# Patient Record
Sex: Female | Born: 1985
Health system: Southern US, Community
[De-identification: ages and names within clinical notes are randomized; demographics above are authoritative.]

## PROBLEM LIST (undated history)

## (undated) DIAGNOSIS — R51 Headache: Secondary | ICD-10-CM

## (undated) DIAGNOSIS — Z8619 Personal history of other infectious and parasitic diseases: Secondary | ICD-10-CM

## (undated) DIAGNOSIS — B009 Herpesviral infection, unspecified: Secondary | ICD-10-CM

## (undated) DIAGNOSIS — F1910 Other psychoactive substance abuse, uncomplicated: Secondary | ICD-10-CM

## (undated) DIAGNOSIS — F419 Anxiety disorder, unspecified: Secondary | ICD-10-CM

## (undated) DIAGNOSIS — R609 Edema, unspecified: Secondary | ICD-10-CM

## (undated) DIAGNOSIS — R519 Headache, unspecified: Secondary | ICD-10-CM

## (undated) HISTORY — DX: Herpesviral infection, unspecified: B00.9

## (undated) HISTORY — DX: Anxiety disorder, unspecified: F41.9

## (undated) HISTORY — DX: Other psychoactive substance abuse, uncomplicated: F19.10

## (undated) HISTORY — DX: Personal history of other infectious and parasitic diseases: Z86.19

## (undated) HISTORY — PX: NO PAST SURGERIES: SHX2092

## (undated) HISTORY — DX: Headache, unspecified: R51.9

## (undated) HISTORY — DX: Headache: R51

---

## 2006-11-21 ENCOUNTER — Ambulatory Visit: Payer: Self-pay | Admitting: Cardiology

## 2011-09-21 ENCOUNTER — Ambulatory Visit: Payer: Self-pay | Admitting: Internal Medicine

## 2011-09-21 VITALS — BP 99/64 | HR 77 | Temp 98.7°F | Resp 16 | Ht 64.0 in | Wt 136.8 lb

## 2011-09-21 DIAGNOSIS — J029 Acute pharyngitis, unspecified: Secondary | ICD-10-CM

## 2011-09-21 DIAGNOSIS — H6690 Otitis media, unspecified, unspecified ear: Secondary | ICD-10-CM

## 2011-09-21 MED ORDER — CEFDINIR 300 MG PO CAPS: 300.0000 mg | ORAL_CAPSULE | Freq: Two times a day (BID) | ORAL | Status: AC

## 2011-09-21 MED ORDER — MOMETASONE FUROATE 50 MCG/ACT NA SUSP: 2.0000 | Freq: Every day | NASAL | Status: DC

## 2011-09-21 NOTE — Patient Instructions (Signed)
Take all of Cefdinir as directed, call if rash or other problems arise.  Use Nasonex to relieve pressure in right ear.  RTC if not improved in 2-3 days.Otitis Media, Adult A middle ear infection is an infection in the space behind the eardrum. It often happens along with a cold. It is caused by a germ that starts growing in that space. Your neck may feel puffy (swollen) on the side of the ear infection. HOME CARE  Take your medicine as told. Finish it even if you start to feel better.   Nose medicine (nasal decongestant) may help the tube that connects the ear and throat (eustachian tube) drain better. It may also help with discomfort.   Follow up with your doctor in 10 to 14 days or as told by your doctor. This is to make sure the infection is gone.  GET HELP RIGHT AWAY IF:   You do not start to feel better in 2 to 3 days.   You have pain that is not helped with medicine.   You cannot use the medicine as told.   You feel worse instead of better.   You develop puffiness, redness, or pain around the ear.   You get a stiff neck.  MAKE SURE YOU:   Understand these instructions.   Will watch your condition.   Will get help right away if you are not doing well or get worse.  Document Released: 01/09/2008 Document Revised: 04/04/2011 Document Reviewed: 01/09/2008 [facility] Patient Information 2012 [place], [place].

## 2011-09-21 NOTE — Progress Notes (Signed)
  Subjective:    Patient ID: Regina French, female    DOB: 1986-06-13, 26 y.o.   MRN: [id number]  Otalgia  There is pain in the right ear. This is a new problem. The current episode started in the past 7 days. The problem occurs constantly. The problem has been gradually worsening. The pain is at a severity of 5/10. The pain is moderate. Associated symptoms include rhinorrhea. Pertinent negatives include no coughing, diarrhea, ear discharge, neck pain, rash or vomiting. She has tried acetaminophen for the symptoms. There is no history of a chronic ear infection, hearing loss or a tympanostomy tube.  Regina French has had sinus congestion for 10-14 days.  Her fiance also has similar symptoms.  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  Is still smoking but plans to quit after school.  Her sinus congestion has worsened over the last few days and her left ear has hurt for 2 days.  Cannot sleep flat, no recent antibiotic use.  Using Mirena as directed and denies pregnancy.    Review of Systems  HENT: Positive for ear pain and rhinorrhea. Negative for neck pain and ear discharge.   Respiratory: Negative for cough.   Gastrointestinal: Negative for vomiting and diarrhea.  Skin: Negative for rash.  All other systems reviewed and are negative.       Objective:   Physical Exam  Constitutional: She is oriented to person, place, and time. She appears well-developed and well-nourished.  HENT:  Head: Normocephalic.  Right Ear: External ear normal.  Left Ear: External ear normal.       Right inflammed TM, slightly bulging.  Left TM appears dull and partially obscured with cerumen.  Neck: Neck supple.       Enlarged right upper cervical nodes, slightly tender.  Cardiovascular: Regular rhythm and normal heart sounds.   Pulmonary/Chest: Effort normal and breath sounds normal. No respiratory distress. She has no wheezes. She has no rales.  Lymphadenopathy:    She has cervical adenopathy.  Neurological: She is  alert and oriented to person, place, and time.  Skin: Skin is warm and dry.  Psychiatric: She has a normal mood and affect. Her behavior is normal.          Assessment & Plan:  Right otitis media.  Cefdinir for 10 days as directed.  Nasonex for ETD and to relieve ear pain.  RTC in 2-3 days if not improved.  Set smoking stop date, pt agrees!

## 2012-08-07 ENCOUNTER — Ambulatory Visit (INDEPENDENT_AMBULATORY_CARE_PROVIDER_SITE_OTHER): Payer: PRIVATE HEALTH INSURANCE | Admitting: Family Medicine

## 2012-08-07 VITALS — BP 104/70 | HR 64 | Temp 99.0°F | Resp 16 | Ht 63.0 in | Wt 131.2 lb

## 2012-08-07 DIAGNOSIS — J029 Acute pharyngitis, unspecified: Secondary | ICD-10-CM

## 2012-08-07 NOTE — Patient Instructions (Signed)
Lozenges, tylenol or nsaid if need for pain. Other instructions as below. Return to the clinic or go to the nearest emergency room if any of your symptoms worsen or new symptoms occur. Viral Pharyngitis Viral pharyngitis is a viral infection that produces redness, pain, and swelling (inflammation) of the throat. It can spread from person to person (contagious). CAUSES Viral pharyngitis is caused by inhaling a large amount of certain germs called viruses. Many different viruses cause viral pharyngitis. SYMPTOMS Symptoms of viral pharyngitis include:  Sore throat.  Tiredness.  Stuffy nose.  Low-grade fever.  Congestion.  Cough. TREATMENT Treatment includes rest, drinking plenty of fluids, and the use of over-the-counter medication (approved by your caregiver). HOME CARE INSTRUCTIONS   Drink enough fluids to keep your urine clear or pale yellow.  Eat soft, cold foods such as ice cream, frozen ice pops, or gelatin dessert.  Gargle with warm salt water (1 tsp salt per 1 qt of water).  If over age 47, throat lozenges may be used safely.  Only take over-the-counter or prescription medicines for pain, discomfort, or fever as directed by your caregiver. Do not take aspirin. To help prevent spreading viral pharyngitis to others, avoid:  Mouth-to-mouth contact with others.  Sharing utensils for eating and drinking.  Coughing around others. SEEK MEDICAL CARE IF:   You are better in a few days, then become worse.  You have a fever or pain not helped by pain medicines.  There are any other changes that concern you. Document Released: 05/02/2005 Document Revised: 10/15/2011 Document Reviewed: 09/28/2010 [facility] Patient Information 2013 [place], [place].

## 2012-08-07 NOTE — Progress Notes (Signed)
Subjective:    Patient ID: Regina French, female    DOB: 06-17-1986, 27 y.o.   MRN: [id number]  HPI Regina French is a 27 y.o. female Started 4 days ago - initially pnd, slight sore throat.  Felt a little better next day, then worse 2 days ago.  Bodyaches. Congestion, some sore throat. Possible fever last night - subjective.   bodyaches and sore throat worst sx today.   alkaseltzer cold and flu, goody's.   Did have flu vaccine in November.   Here with husband with similar symptoms.   Review of Systems  HENT: Positive for congestion and sore throat. Negative for trouble swallowing and voice change.   Respiratory: Negative for cough and shortness of breath.   Musculoskeletal: Positive for myalgias and arthralgias.       Objective:   Physical Exam  Vitals reviewed. Constitutional: She is oriented to person, place, and time. She appears well-developed and well-nourished. No distress.  HENT:  Head: Normocephalic and atraumatic.  Right Ear: Hearing, tympanic membrane, external ear and ear canal normal.  Left Ear: Hearing, tympanic membrane, external ear and ear canal normal.  Nose: Nose normal.  Mouth/Throat: Oropharynx is clear and moist. No oropharyngeal exudate.  Eyes: Conjunctivae normal and EOM are normal. Pupils are equal, round, and reactive to light.  Cardiovascular: Normal rate, regular rhythm, normal heart sounds and intact distal pulses.   No murmur heard. Pulmonary/Chest: Effort normal and breath sounds normal. No respiratory distress. She has no wheezes. She has no rhonchi.  Lymphadenopathy:    She has cervical adenopathy (few tender, slighlty enlarged ac nodes bilaterlally. ).  Neurological: She is alert and oriented to person, place, and time.  Skin: Skin is warm and dry. No rash noted.  Psychiatric: She has a normal mood and affect. Her behavior is normal.     Results for orders placed in visit on 08/07/12  POCT RAPID STREP A (OFFICE)      Component Value  Range   Rapid Strep A Screen Negative  Negative       Assessment & Plan:  [name] is a 27 y.o. female 1. Sore throat  POCT rapid strep A  body aches, and sick contact with likely influenza.  As she has had flu vaccine - may be milder course, and with sx's for 4 days did not check flu test or tamiflu rx.  Discussed throat cx - declined. Sx care - rtc precautions discussed.   Patient Instructions  Lozenges, tylenol or nsaid if need for pain. Other instructions as below. Return to the clinic or go to the nearest emergency room if any of your symptoms worsen or new symptoms occur. Viral Pharyngitis Viral pharyngitis is a viral infection that produces redness, pain, and swelling (inflammation) of the throat. It can spread from person to person (contagious). CAUSES Viral pharyngitis is caused by inhaling a large amount of certain germs called viruses. Many different viruses cause viral pharyngitis. SYMPTOMS Symptoms of viral pharyngitis include:  Sore throat.  Tiredness.  Stuffy nose.  Low-grade fever.  Congestion.  Cough. TREATMENT Treatment includes rest, drinking plenty of fluids, and the use of over-the-counter medication (approved by your caregiver). HOME CARE INSTRUCTIONS   Drink enough fluids to keep your urine clear or pale yellow.  Eat soft, cold foods such as ice cream, frozen ice pops, or gelatin dessert.  Gargle with warm salt water (1 tsp salt per 1 qt of water).  If over age 43, throat lozenges may be used safely.  Only take over-the-counter or prescription medicines for pain, discomfort, or fever as directed by your caregiver. Do not take aspirin. To help prevent spreading viral pharyngitis to others, avoid:  Mouth-to-mouth contact with others.  Sharing utensils for eating and drinking.  Coughing around others. SEEK MEDICAL CARE IF:   You are better in a few days, then become worse.  You have a fever or pain not helped by pain medicines.  There  are any other changes that concern you. Document Released: 05/02/2005 Document Revised: 10/15/2011 Document Reviewed: 09/28/2010 [facility] Patient Information 2013 [place], [place].

## 2012-10-30 ENCOUNTER — Other Ambulatory Visit (HOSPITAL_COMMUNITY)
Admission: RE | Admit: 2012-10-30 | Discharge: 2012-10-30 | Disposition: A | Discharge status: Home / Self Care | Payer: Self-pay | Source: Ambulatory Visit | Attending: Family Medicine | Admitting: Family Medicine

## 2012-10-30 ENCOUNTER — Other Ambulatory Visit: Payer: Self-pay | Admitting: Family Medicine

## 2012-10-30 DIAGNOSIS — Z124 Encounter for screening for malignant neoplasm of cervix: Secondary | ICD-10-CM | POA: Insufficient documentation

## 2013-12-02 LAB — OB RESULTS CONSOLE RUBELLA ANTIBODY, IGM: RUBELLA: IMMUNE

## 2013-12-02 LAB — OB RESULTS CONSOLE RPR: RPR: NONREACTIVE

## 2013-12-02 LAB — OB RESULTS CONSOLE HEPATITIS B SURFACE ANTIGEN: Hepatitis B Surface Ag: NEGATIVE

## 2013-12-02 LAB — OB RESULTS CONSOLE HIV ANTIBODY (ROUTINE TESTING): HIV: NONREACTIVE

## 2013-12-02 LAB — OB RESULTS CONSOLE ABO/RH: RH Type: POSITIVE

## 2013-12-02 LAB — OB RESULTS CONSOLE ANTIBODY SCREEN: Antibody Screen: NEGATIVE

## 2013-12-08 LAB — OB RESULTS CONSOLE GC/CHLAMYDIA
CHLAMYDIA, DNA PROBE: NEGATIVE
GC PROBE AMP, GENITAL: NEGATIVE

## 2014-06-29 LAB — OB RESULTS CONSOLE GBS: STREP GROUP B AG: NEGATIVE

## 2014-07-07 ENCOUNTER — Encounter (HOSPITAL_COMMUNITY): Payer: Self-pay | Admitting: *Deleted

## 2014-07-07 ENCOUNTER — Telehealth (HOSPITAL_COMMUNITY): Payer: Self-pay | Admitting: *Deleted

## 2014-07-07 NOTE — Telephone Encounter (Signed)
Preadmission screen  

## 2014-07-08 ENCOUNTER — Other Ambulatory Visit: Payer: Self-pay | Admitting: Obstetrics & Gynecology

## 2014-07-12 ENCOUNTER — Encounter (HOSPITAL_COMMUNITY): Payer: Self-pay

## 2014-07-12 ENCOUNTER — Inpatient Hospital Stay (HOSPITAL_COMMUNITY): Payer: PRIVATE HEALTH INSURANCE | Admitting: Anesthesiology

## 2014-07-12 ENCOUNTER — Inpatient Hospital Stay (HOSPITAL_COMMUNITY)
Admission: RE | Admit: 2014-07-12 | Discharge: 2014-07-16 | DRG: 765 | Disposition: A | Discharge status: Home / Self Care | Payer: PRIVATE HEALTH INSURANCE | Source: Ambulatory Visit | Attending: Obstetrics & Gynecology | Admitting: Obstetrics & Gynecology

## 2014-07-12 DIAGNOSIS — Z3490 Encounter for supervision of normal pregnancy, unspecified, unspecified trimester: Secondary | ICD-10-CM

## 2014-07-12 DIAGNOSIS — D62 Acute posthemorrhagic anemia: Secondary | ICD-10-CM | POA: Diagnosis present

## 2014-07-12 DIAGNOSIS — O9081 Anemia of the puerperium: Secondary | ICD-10-CM | POA: Diagnosis present

## 2014-07-12 DIAGNOSIS — D509 Iron deficiency anemia, unspecified: Secondary | ICD-10-CM | POA: Diagnosis present

## 2014-07-12 DIAGNOSIS — D271 Benign neoplasm of left ovary: Secondary | ICD-10-CM | POA: Diagnosis present

## 2014-07-12 DIAGNOSIS — O9852 Other viral diseases complicating childbirth: Secondary | ICD-10-CM | POA: Diagnosis present

## 2014-07-12 DIAGNOSIS — B009 Herpesviral infection, unspecified: Secondary | ICD-10-CM | POA: Diagnosis present

## 2014-07-12 DIAGNOSIS — O3483 Maternal care for other abnormalities of pelvic organs, third trimester: Secondary | ICD-10-CM | POA: Diagnosis present

## 2014-07-12 DIAGNOSIS — Z87891 Personal history of nicotine dependence: Secondary | ICD-10-CM | POA: Diagnosis not present

## 2014-07-12 DIAGNOSIS — R609 Edema, unspecified: Secondary | ICD-10-CM | POA: Diagnosis not present

## 2014-07-12 DIAGNOSIS — Z3A40 40 weeks gestation of pregnancy: Secondary | ICD-10-CM | POA: Diagnosis present

## 2014-07-12 HISTORY — DX: Edema, unspecified: R60.9

## 2014-07-12 LAB — CBC
HCT: 36.5 % (ref 36.0–46.0)
Hemoglobin: 13 g/dL (ref 12.0–15.0)
MCH: 32 pg (ref 26.0–34.0)
MCHC: 35.6 g/dL (ref 30.0–36.0)
MCV: 89.9 fL (ref 78.0–100.0)
PLATELETS: 182 10*3/uL (ref 150–400)
RBC: 4.06 MIL/uL (ref 3.87–5.11)
RDW: 12.7 % (ref 11.5–15.5)
WBC: 10.8 10*3/uL — AB (ref 4.0–10.5)

## 2014-07-12 LAB — TYPE AND SCREEN
ABO/RH(D): O POS
Antibody Screen: NEGATIVE

## 2014-07-12 LAB — RPR

## 2014-07-12 LAB — ABO/RH: ABO/RH(D): O POS

## 2014-07-12 MED ORDER — OXYCODONE-ACETAMINOPHEN 5-325 MG PO TABS: 2.0000 | ORAL_TABLET | ORAL | Status: DC | PRN

## 2014-07-12 MED ORDER — PHENYLEPHRINE 40 MCG/ML (10ML) SYRINGE FOR IV PUSH (FOR BLOOD PRESSURE SUPPORT)
PREFILLED_SYRINGE | INTRAVENOUS | Status: AC
  Filled 2014-07-12: qty 10

## 2014-07-12 MED ORDER — ACETAMINOPHEN 325 MG PO TABS
650.0000 mg | ORAL_TABLET | ORAL | Status: DC | PRN
Start: 2014-07-12 — End: 2014-07-13
  Administered 2014-07-13: 650 mg via ORAL
  Filled 2014-07-12: qty 2

## 2014-07-12 MED ORDER — CITRIC ACID-SODIUM CITRATE 334-500 MG/5ML PO SOLN
30.0000 mL | ORAL | Status: DC | PRN
  Administered 2014-07-13: 30 mL via ORAL
  Filled 2014-07-12 (×2): qty 15

## 2014-07-12 MED ORDER — FENTANYL 2.5 MCG/ML BUPIVACAINE 1/10 % EPIDURAL INFUSION (WH - ANES)
INTRAMUSCULAR | Status: AC
  Administered 2014-07-12: 14 mL/h via EPIDURAL
  Filled 2014-07-12: qty 125

## 2014-07-12 MED ORDER — DIPHENHYDRAMINE HCL 50 MG/ML IJ SOLN: 12.5000 mg | INTRAMUSCULAR | Status: DC | PRN

## 2014-07-12 MED ORDER — OXYCODONE-ACETAMINOPHEN 5-325 MG PO TABS: 1.0000 | ORAL_TABLET | ORAL | Status: DC | PRN

## 2014-07-12 MED ORDER — LACTATED RINGERS IV SOLN
500.0000 mL | Freq: Once | INTRAVENOUS | Status: AC
  Administered 2014-07-12: 500 mL via INTRAVENOUS

## 2014-07-12 MED ORDER — LIDOCAINE HCL (PF) 1 % IJ SOLN: 30.0000 mL | INTRAMUSCULAR | Status: DC | PRN

## 2014-07-12 MED ORDER — LIDOCAINE HCL (PF) 1 % IJ SOLN
INTRAMUSCULAR | Status: DC | PRN
  Administered 2014-07-12: 8 mL
  Administered 2014-07-12: 9 mL

## 2014-07-12 MED ORDER — SODIUM BICARBONATE 8.4 % IV SOLN
INTRAVENOUS | Status: DC | PRN
  Administered 2014-07-12: 3 mL via EPIDURAL
  Administered 2014-07-12: 7 mL via EPIDURAL
  Administered 2014-07-13: 5 mL via EPIDURAL
  Administered 2014-07-13: 3 mL via EPIDURAL
  Administered 2014-07-13: 7 mL via EPIDURAL
  Administered 2014-07-13 (×4): 5 mL via EPIDURAL

## 2014-07-12 MED ORDER — OXYTOCIN BOLUS FROM INFUSION: 500.0000 mL | INTRAVENOUS | Status: DC

## 2014-07-12 MED ORDER — EPHEDRINE 5 MG/ML INJ: 10.0000 mg | INTRAVENOUS | Status: DC | PRN

## 2014-07-12 MED ORDER — LACTATED RINGERS IV SOLN
INTRAVENOUS | Status: DC
  Administered 2014-07-12 – 2014-07-13 (×7): via INTRAVENOUS

## 2014-07-12 MED ORDER — PHENYLEPHRINE 40 MCG/ML (10ML) SYRINGE FOR IV PUSH (FOR BLOOD PRESSURE SUPPORT)
80.0000 ug | PREFILLED_SYRINGE | INTRAVENOUS | Status: DC | PRN
  Administered 2014-07-12: 80 ug via INTRAVENOUS

## 2014-07-12 MED ORDER — FENTANYL 2.5 MCG/ML BUPIVACAINE 1/10 % EPIDURAL INFUSION (WH - ANES)
INTRAMUSCULAR | Status: DC | PRN
  Administered 2014-07-12: 14 mL/h via EPIDURAL

## 2014-07-12 MED ORDER — FENTANYL 2.5 MCG/ML BUPIVACAINE 1/10 % EPIDURAL INFUSION (WH - ANES)
14.0000 mL/h | INTRAMUSCULAR | Status: DC | PRN
  Administered 2014-07-12: 14 mL/h via EPIDURAL
  Filled 2014-07-12 (×2): qty 125

## 2014-07-12 MED ORDER — LACTATED RINGERS IV SOLN: 500.0000 mL | INTRAVENOUS | Status: DC | PRN

## 2014-07-12 MED ORDER — OXYTOCIN 40 UNITS IN LACTATED RINGERS INFUSION - SIMPLE MED: 62.5000 mL/h | INTRAVENOUS | Status: DC

## 2014-07-12 MED ORDER — OXYTOCIN 40 UNITS IN LACTATED RINGERS INFUSION - SIMPLE MED
1.0000 m[IU]/min | INTRAVENOUS | Status: DC
  Administered 2014-07-12 – 2014-07-13 (×2): 2 m[IU]/min via INTRAVENOUS
  Filled 2014-07-12: qty 1000

## 2014-07-12 MED ORDER — ONDANSETRON HCL 4 MG/2ML IJ SOLN
4.0000 mg | Freq: Four times a day (QID) | INTRAMUSCULAR | Status: DC | PRN
  Administered 2014-07-12: 4 mg via INTRAVENOUS
  Filled 2014-07-12: qty 2

## 2014-07-12 MED ORDER — PHENYLEPHRINE 40 MCG/ML (10ML) SYRINGE FOR IV PUSH (FOR BLOOD PRESSURE SUPPORT): 80.0000 ug | PREFILLED_SYRINGE | INTRAVENOUS | Status: DC | PRN

## 2014-07-12 MED ORDER — TERBUTALINE SULFATE 1 MG/ML IJ SOLN: 0.2500 mg | Freq: Once | INTRAMUSCULAR | Status: AC | PRN

## 2014-07-12 NOTE — H&P (Signed)
Regina French is a 28 y.o. female G2P0, at 40 wks here for IOL per patient request due to worsening pain. Pregnancy- Ob care from 1st trim, normal labs,  complicated by abdominal and back pain, left ovarian dermoid that stayed stable at 5 cm, failed 1 hr Glucola x 2 but passed 3 hr GTT, HSV hx, no outbreaks and is on suppression, normal growth sono in 3rd trimester  History OB History    Gravida Para Term Preterm AB TAB SAB Ectopic Multiple Living   1              Past Medical History  Diagnosis Date  . Substance abuse   . Herpes   . Headache   . Hx of varicella    Past Surgical History  Procedure Laterality Date  . No past surgeries     Family History: family history includes Birth defects in her cousin; Cancer in her mother; Heart disease in her maternal grandfather. Social History:  reports that she quit smoking about 8 months ago. She does not have any smokeless tobacco history on file. She reports that she does not drink alcohol or use illicit drugs.   Prenatal Transfer Tool  Maternal Diabetes: No Genetic Screening: Declined AFP1 neg Maternal Ultrasounds/Referrals: Normal, maternal left ovarian dermoid  Fetal Ultrasounds or other Referrals:  None Maternal Substance Abuse:  No Significant Maternal Medications:  Meds include: Other: Valtrex Significant Maternal Lab Results:  Lab values include: Group B Strep negative Other Comments:  None  ROS  Neg   Dilation: 1.5 Effacement (%): 60 Station: -2 Exam by:: k fields, rn Blood pressure 118/77, pulse 94, temperature 98.4 F (36.9 C), temperature source Oral, resp. rate 20, height 5\' 4"  (1.626 m), weight 180 lb (81.647 kg). Exam Physical Exam   A&O x 3, no acute distress. Pleasant HEENT neg, no thyromegaly Lungs CTA bilat CV RRR, S1S2 normal Abdo soft, non tender, non acute, gravid uterus Extr no edema/ tenderness Pelvic above. Adequate pelvis, stn high but early labor  FHT  145/ + accels/ no decels/ modrt variab-  category I Toco  Irreg, on pitocin  Prenatal labs: ABO, Rh: --/--/O POS, O POS (12/07 7353) Antibody: NEG (12/07 0705) Rubella: Immune (04/29 0000) RPR: Nonreactive (04/29 0000)  HBsAg: Negative (04/29 0000)  HIV: Non-reactive (04/29 0000)  GBS: Negative (11/24 0000)  Glucola abn x2, 3hr GTT normal   Assessment/Plan: 28 yo G2P0 at 40 wks for labor IOL, favorable cervix. GBS(-). Pitocin, AROM when favorable.  Anticipate SVD, EFW 7.1/2 -8 lbs   Regina French R 07/12/2014, 1:33 PM

## 2014-07-12 NOTE — Anesthesia Procedure Notes (Addendum)
Epidural Patient location during procedure: OB Start time: 07/12/2014 8:08 PM End time: 07/12/2014 8:12 PM  Staffing Anesthesiologist: Lyn Hollingshead  Preanesthetic Checklist Completed: patient identified, surgical consent, pre-op evaluation, timeout performed, IV checked, risks and benefits discussed and monitors and equipment checked  Epidural Patient position: sitting Prep: site prepped and draped and DuraPrep Patient monitoring: continuous pulse ox and blood pressure Approach: midline Location: L2-L3 Injection technique: LOR air  Needle:  Needle type: Tuohy  Needle gauge: 17 G Needle length: 9 cm and 9 Needle insertion depth: 6 cm Catheter type: closed end flexible Catheter size: 19 Gauge Catheter at skin depth: 13 cm Test dose: negative and Other  Assessment Sensory level: T9 Events: blood not aspirated, injection not painful, no injection resistance, negative IV test and no paresthesia  Additional Notes Dosed thru needle 3+5+5+5cc 2%Xylo, with necessary time intervals between doses. (-) Sx of SAB/IV SX. Catheter easily passed. Pt developed Sx of non-compliant epidural space as I was taping the back. (-) asp CatheterReason for block:procedure for pain

## 2014-07-12 NOTE — Progress Notes (Signed)
Regina French is a 28 y.o. G1P0 at [redacted]w[redacted]d by LMP admitted for induction of labor due to Elective at term.  Subjective: feels some UCs, no pain  Objective: BP 147/74 mmHg  Pulse 85  Temp(Src) 98.6 F (37 C) (Oral)  Resp 18  Ht 5\' 4"  (1.626 m)  Wt 180 lb (81.647 kg)  BMI 30.88 kg/m2   FHT:  FHR: 150 bpm, variability: moderate,  accelerations:  Present,  decelerations:  Absent UC:   regular, every 3 minutes SVE:   Dilation: 2 Effacement (%): 50 Station: -3 Exam by:: Kieren Adkison Arom, celar fluid. High station, not flexed or engaged.   Labs: Lab Results  Component Value Date   WBC 10.8* 07/12/2014   HGB 13.0 07/12/2014   HCT 36.5 07/12/2014   MCV 89.9 07/12/2014   PLT 182 07/12/2014    Assessment / Plan: Induction of labor due to term with favorable cervix,  progressing well on pitocin Long latent phase, continue pitocin, assess flexion and decent. Ambulate, continue pitocin. GBS(-). EFW 7.1/2 lbs. Pelvis borderline.    Tyrease Vandeberg R 07/12/2014, 5:22 PM

## 2014-07-12 NOTE — Anesthesia Preprocedure Evaluation (Addendum)
Anesthesia Evaluation  Patient identified by MRN, date of birth, ID band Patient awake    Reviewed: Allergy & Precautions, H&P , NPO status , Patient's Chart, lab work & pertinent test results  Airway Mallampati: I  TM Distance: >3 FB Neck ROM: full    Dental no notable dental hx.    Pulmonary former smoker,    Pulmonary exam normal       Cardiovascular negative cardio ROS      Neuro/Psych negative psych ROS   GI/Hepatic negative GI ROS, Neg liver ROS,   Endo/Other  negative endocrine ROS  Renal/GU negative Renal ROS     Musculoskeletal   Abdominal Normal abdominal exam  (+)   Peds  Hematology negative hematology ROS (+)   Anesthesia Other Findings   Reproductive/Obstetrics (+) Pregnancy (failure to progress --> C/S)                            Anesthesia Physical Anesthesia Plan  ASA: II and emergent  Anesthesia Plan: Epidural   Post-op Pain Management:    Induction:   Airway Management Planned:   Additional Equipment:   Intra-op Plan:   Post-operative Plan:   Informed Consent: I have reviewed the patients History and Physical, chart, labs and discussed the procedure including the risks, benefits and alternatives for the proposed anesthesia with the patient or authorized representative who has indicated his/her understanding and acceptance.     Plan Discussed with: Surgeon and CRNA  Anesthesia Plan Comments:        Anesthesia Quick Evaluation

## 2014-07-13 ENCOUNTER — Encounter (HOSPITAL_COMMUNITY)
Admission: RE | Disposition: A | Discharge status: Home / Self Care | Payer: Self-pay | Source: Ambulatory Visit | Attending: Obstetrics & Gynecology

## 2014-07-13 ENCOUNTER — Encounter (HOSPITAL_COMMUNITY): Payer: Self-pay

## 2014-07-13 LAB — CBC
HCT: 27.9 % — ABNORMAL LOW (ref 36.0–46.0)
Hemoglobin: 9.7 g/dL — ABNORMAL LOW (ref 12.0–15.0)
MCH: 31.7 pg (ref 26.0–34.0)
MCHC: 34.8 g/dL (ref 30.0–36.0)
MCV: 91.2 fL (ref 78.0–100.0)
PLATELETS: 160 10*3/uL (ref 150–400)
RBC: 3.06 MIL/uL — ABNORMAL LOW (ref 3.87–5.11)
RDW: 12.9 % (ref 11.5–15.5)
WBC: 18.2 10*3/uL — AB (ref 4.0–10.5)

## 2014-07-13 SURGERY — Surgical Case
Anesthesia: Epidural | Site: Abdomen

## 2014-07-13 MED ORDER — OXYTOCIN 40 UNITS IN LACTATED RINGERS INFUSION - SIMPLE MED: 62.5000 mL/h | INTRAVENOUS | Status: DC

## 2014-07-13 MED ORDER — TETANUS-DIPHTH-ACELL PERTUSSIS 5-2.5-18.5 LF-MCG/0.5 IM SUSP: 0.5000 mL | Freq: Once | INTRAMUSCULAR | Status: DC

## 2014-07-13 MED ORDER — SIMETHICONE 80 MG PO CHEW
80.0000 mg | CHEWABLE_TABLET | ORAL | Status: DC | PRN
  Administered 2014-07-15: 80 mg via ORAL
  Filled 2014-07-13: qty 1

## 2014-07-13 MED ORDER — NALBUPHINE HCL 10 MG/ML IJ SOLN: 5.0000 mg | Freq: Once | INTRAMUSCULAR | Status: AC | PRN

## 2014-07-13 MED ORDER — OXYTOCIN 10 UNIT/ML IJ SOLN
40.0000 [IU] | INTRAMUSCULAR | Status: DC | PRN
  Administered 2014-07-13: 40 [IU] via INTRAVENOUS

## 2014-07-13 MED ORDER — KETOROLAC TROMETHAMINE 30 MG/ML IJ SOLN: 30.0000 mg | Freq: Four times a day (QID) | INTRAMUSCULAR | Status: DC | PRN

## 2014-07-13 MED ORDER — MEPERIDINE HCL 25 MG/ML IJ SOLN: 6.2500 mg | INTRAMUSCULAR | Status: DC | PRN

## 2014-07-13 MED ORDER — PRENATAL MULTIVITAMIN CH
1.0000 | ORAL_TABLET | Freq: Every day | ORAL | Status: DC
  Administered 2014-07-14 – 2014-07-16 (×3): 1 via ORAL
  Filled 2014-07-13 (×3): qty 1

## 2014-07-13 MED ORDER — LACTATED RINGERS IV SOLN
INTRAVENOUS | Status: DC | PRN
  Administered 2014-07-13: 09:00:00 via INTRAVENOUS

## 2014-07-13 MED ORDER — SODIUM CHLORIDE 0.9 % IJ SOLN: 3.0000 mL | INTRAMUSCULAR | Status: DC | PRN

## 2014-07-13 MED ORDER — LANOLIN HYDROUS EX OINT: 1.0000 "application " | TOPICAL_OINTMENT | CUTANEOUS | Status: DC | PRN

## 2014-07-13 MED ORDER — ONDANSETRON HCL 4 MG/2ML IJ SOLN: 4.0000 mg | Freq: Three times a day (TID) | INTRAMUSCULAR | Status: DC | PRN

## 2014-07-13 MED ORDER — NALBUPHINE HCL 10 MG/ML IJ SOLN
5.0000 mg | INTRAMUSCULAR | Status: DC | PRN
  Filled 2014-07-13: qty 1

## 2014-07-13 MED ORDER — ONDANSETRON HCL 4 MG/2ML IJ SOLN: 4.0000 mg | INTRAMUSCULAR | Status: DC | PRN

## 2014-07-13 MED ORDER — OXYCODONE-ACETAMINOPHEN 5-325 MG PO TABS
1.0000 | ORAL_TABLET | ORAL | Status: DC | PRN
  Administered 2014-07-14 – 2014-07-16 (×10): 1 via ORAL
  Filled 2014-07-13 (×10): qty 1

## 2014-07-13 MED ORDER — FENTANYL CITRATE 0.05 MG/ML IJ SOLN
INTRAMUSCULAR | Status: AC
  Filled 2014-07-13: qty 2

## 2014-07-13 MED ORDER — FENTANYL CITRATE 0.05 MG/ML IJ SOLN: 25.0000 ug | INTRAMUSCULAR | Status: DC | PRN

## 2014-07-13 MED ORDER — LACTATED RINGERS IV SOLN
INTRAVENOUS | Status: DC
  Administered 2014-07-13: 16:00:00 via INTRAVENOUS

## 2014-07-13 MED ORDER — NALBUPHINE HCL 10 MG/ML IJ SOLN
5.0000 mg | INTRAMUSCULAR | Status: DC | PRN
  Administered 2014-07-13 (×2): 5 mg via SUBCUTANEOUS
  Filled 2014-07-13: qty 1

## 2014-07-13 MED ORDER — DIPHENHYDRAMINE HCL 50 MG/ML IJ SOLN: 12.5000 mg | INTRAMUSCULAR | Status: DC | PRN

## 2014-07-13 MED ORDER — PHENYLEPHRINE 8 MG IN D5W 100 ML (0.08MG/ML) PREMIX OPTIME: INJECTION | INTRAVENOUS | Status: DC | PRN

## 2014-07-13 MED ORDER — SENNOSIDES-DOCUSATE SODIUM 8.6-50 MG PO TABS
2.0000 | ORAL_TABLET | ORAL | Status: DC
  Administered 2014-07-13 – 2014-07-16 (×3): 2 via ORAL
  Filled 2014-07-13 (×3): qty 2

## 2014-07-13 MED ORDER — ONDANSETRON HCL 4 MG/2ML IJ SOLN
INTRAMUSCULAR | Status: AC
  Filled 2014-07-13: qty 2

## 2014-07-13 MED ORDER — DIPHENHYDRAMINE HCL 25 MG PO CAPS
25.0000 mg | ORAL_CAPSULE | Freq: Four times a day (QID) | ORAL | Status: DC | PRN
  Administered 2014-07-14: 25 mg via ORAL
  Filled 2014-07-13: qty 1

## 2014-07-13 MED ORDER — ONDANSETRON HCL 4 MG PO TABS
4.0000 mg | ORAL_TABLET | ORAL | Status: DC | PRN
Start: 2014-07-13 — End: 2014-07-14

## 2014-07-13 MED ORDER — NALOXONE HCL 0.4 MG/ML IJ SOLN: 0.4000 mg | INTRAMUSCULAR | Status: DC | PRN

## 2014-07-13 MED ORDER — MENTHOL 3 MG MT LOZG: 1.0000 | LOZENGE | OROMUCOSAL | Status: DC | PRN

## 2014-07-13 MED ORDER — OXYTOCIN 10 UNIT/ML IJ SOLN
INTRAMUSCULAR | Status: AC
  Filled 2014-07-13: qty 4

## 2014-07-13 MED ORDER — DIPHENHYDRAMINE HCL 25 MG PO CAPS: 25.0000 mg | ORAL_CAPSULE | ORAL | Status: DC | PRN

## 2014-07-13 MED ORDER — KETOROLAC TROMETHAMINE 30 MG/ML IJ SOLN
INTRAMUSCULAR | Status: AC
  Filled 2014-07-13: qty 1

## 2014-07-13 MED ORDER — GENTAMICIN SULFATE 40 MG/ML IJ SOLN
INTRAVENOUS | Status: AC
  Administered 2014-07-13: 116.25 mL via INTRAVENOUS
  Filled 2014-07-13: qty 10.25

## 2014-07-13 MED ORDER — ZOLPIDEM TARTRATE 5 MG PO TABS: 5.0000 mg | ORAL_TABLET | Freq: Every evening | ORAL | Status: DC | PRN

## 2014-07-13 MED ORDER — SIMETHICONE 80 MG PO CHEW
80.0000 mg | CHEWABLE_TABLET | Freq: Three times a day (TID) | ORAL | Status: DC
  Administered 2014-07-13 – 2014-07-16 (×7): 80 mg via ORAL
  Filled 2014-07-13 (×7): qty 1

## 2014-07-13 MED ORDER — PHENYLEPHRINE 8 MG IN D5W 100 ML (0.08MG/ML) PREMIX OPTIME
INJECTION | INTRAVENOUS | Status: AC
  Filled 2014-07-13: qty 100

## 2014-07-13 MED ORDER — SODIUM BICARBONATE 8.4 % IV SOLN
INTRAVENOUS | Status: DC | PRN
  Administered 2014-07-13: 10 mL via EPIDURAL

## 2014-07-13 MED ORDER — IBUPROFEN 600 MG PO TABS
600.0000 mg | ORAL_TABLET | Freq: Four times a day (QID) | ORAL | Status: DC
  Administered 2014-07-13 – 2014-07-16 (×12): 600 mg via ORAL
  Filled 2014-07-13 (×12): qty 1

## 2014-07-13 MED ORDER — MORPHINE SULFATE (PF) 0.5 MG/ML IJ SOLN
INTRAMUSCULAR | Status: DC | PRN
  Administered 2014-07-13: 2 mg via INTRAVENOUS
  Administered 2014-07-13: 3 mg via EPIDURAL

## 2014-07-13 MED ORDER — ONDANSETRON HCL 4 MG/2ML IJ SOLN
INTRAMUSCULAR | Status: DC | PRN
  Administered 2014-07-13: 4 mg via INTRAVENOUS

## 2014-07-13 MED ORDER — MORPHINE SULFATE 0.5 MG/ML IJ SOLN
INTRAMUSCULAR | Status: AC
  Filled 2014-07-13: qty 10

## 2014-07-13 MED ORDER — KETOROLAC TROMETHAMINE 30 MG/ML IJ SOLN
15.0000 mg | Freq: Once | INTRAMUSCULAR | Status: AC | PRN
  Administered 2014-07-13: 30 mg via INTRAVENOUS

## 2014-07-13 MED ORDER — CHLOROPROCAINE HCL 3 % IJ SOLN
INTRAMUSCULAR | Status: AC
  Filled 2014-07-13: qty 20

## 2014-07-13 MED ORDER — SCOPOLAMINE 1 MG/3DAYS TD PT72
1.0000 | MEDICATED_PATCH | Freq: Once | TRANSDERMAL | Status: AC
  Administered 2014-07-13: 1.5 mg via TRANSDERMAL

## 2014-07-13 MED ORDER — FENTANYL CITRATE 0.05 MG/ML IJ SOLN
INTRAMUSCULAR | Status: DC | PRN
  Administered 2014-07-13: 100 ug via INTRAVENOUS
  Administered 2014-07-13: 100 ug via EPIDURAL

## 2014-07-13 MED ORDER — LIDOCAINE-EPINEPHRINE (PF) 2 %-1:200000 IJ SOLN
INTRAMUSCULAR | Status: AC
  Filled 2014-07-13: qty 20

## 2014-07-13 MED ORDER — WITCH HAZEL-GLYCERIN EX PADS: 1.0000 "application " | MEDICATED_PAD | CUTANEOUS | Status: DC | PRN

## 2014-07-13 MED ORDER — PHENYLEPHRINE 8 MG IN D5W 100 ML (0.08MG/ML) PREMIX OPTIME
INJECTION | INTRAVENOUS | Status: DC | PRN
  Administered 2014-07-13: 30 ug/min via INTRAVENOUS

## 2014-07-13 MED ORDER — PROMETHAZINE HCL 25 MG/ML IJ SOLN: 6.2500 mg | INTRAMUSCULAR | Status: DC | PRN

## 2014-07-13 MED ORDER — SCOPOLAMINE 1 MG/3DAYS TD PT72
MEDICATED_PATCH | TRANSDERMAL | Status: AC
  Filled 2014-07-13: qty 1

## 2014-07-13 MED ORDER — SODIUM BICARBONATE 8.4 % IV SOLN
INTRAVENOUS | Status: AC
  Filled 2014-07-13: qty 50

## 2014-07-13 MED ORDER — SIMETHICONE 80 MG PO CHEW
80.0000 mg | CHEWABLE_TABLET | ORAL | Status: DC
  Administered 2014-07-15 – 2014-07-16 (×2): 80 mg via ORAL
  Filled 2014-07-13 (×3): qty 1

## 2014-07-13 MED ORDER — OXYCODONE-ACETAMINOPHEN 5-325 MG PO TABS: 2.0000 | ORAL_TABLET | ORAL | Status: DC | PRN

## 2014-07-13 MED ORDER — NALOXONE HCL 1 MG/ML IJ SOLN
1.0000 ug/kg/h | INTRAMUSCULAR | Status: DC | PRN
  Filled 2014-07-13: qty 2

## 2014-07-13 MED ORDER — DIBUCAINE 1 % RE OINT: 1.0000 "application " | TOPICAL_OINTMENT | RECTAL | Status: DC | PRN

## 2014-07-13 SURGICAL SUPPLY — 42 items
BARRIER ADHS 3X4 INTERCEED (GAUZE/BANDAGES/DRESSINGS) ×3 IMPLANT
BENZOIN TINCTURE PRP APPL 2/3 (GAUZE/BANDAGES/DRESSINGS) ×3 IMPLANT
CLAMP CORD UMBIL (MISCELLANEOUS) IMPLANT
CLOSURE WOUND 1/2 X4 (GAUZE/BANDAGES/DRESSINGS)
CLOTH BEACON ORANGE TIMEOUT ST (SAFETY) ×3 IMPLANT
CONTAINER PREFILL 10% NBF 15ML (MISCELLANEOUS) IMPLANT
DRAPE SHEET LG 3/4 BI-LAMINATE (DRAPES) IMPLANT
DRSG OPSITE POSTOP 4X10 (GAUZE/BANDAGES/DRESSINGS) ×3 IMPLANT
DURAPREP 26ML APPLICATOR (WOUND CARE) ×3 IMPLANT
ELECT REM PT RETURN 9FT ADLT (ELECTROSURGICAL) ×3
ELECTRODE REM PT RTRN 9FT ADLT (ELECTROSURGICAL) ×1 IMPLANT
EXTRACTOR VACUUM KIWI (MISCELLANEOUS) IMPLANT
EXTRACTOR VACUUM M CUP 4 TUBE (SUCTIONS) IMPLANT
EXTRACTOR VACUUM M CUP 4' TUBE (SUCTIONS)
GLOVE BIO SURGEON STRL SZ7 (GLOVE) ×3 IMPLANT
GLOVE BIOGEL PI IND STRL 7.0 (GLOVE) ×1 IMPLANT
GLOVE BIOGEL PI INDICATOR 7.0 (GLOVE) ×2
GOWN STRL REUS W/TWL LRG LVL3 (GOWN DISPOSABLE) ×6 IMPLANT
HEMOSTAT SURGICEL 4X8 (HEMOSTASIS) ×3 IMPLANT
KIT ABG SYR 3ML LUER SLIP (SYRINGE) IMPLANT
NEEDLE HYPO 25X5/8 SAFETYGLIDE (NEEDLE) IMPLANT
NS IRRIG 1000ML POUR BTL (IV SOLUTION) ×3 IMPLANT
PACK C SECTION WH (CUSTOM PROCEDURE TRAY) ×3 IMPLANT
PAD OB MATERNITY 4.3X12.25 (PERSONAL CARE ITEMS) ×3 IMPLANT
RTRCTR C-SECT PINK 25CM LRG (MISCELLANEOUS) IMPLANT
STAPLER VISISTAT 35W (STAPLE) IMPLANT
STRIP CLOSURE SKIN 1/2X4 (GAUZE/BANDAGES/DRESSINGS) IMPLANT
SUT MON AB 3-0 SH 27 (SUTURE) ×2
SUT MON AB 3-0 SH27 (SUTURE) ×1 IMPLANT
SUT MON AB-0 CT1 36 (SUTURE) ×9 IMPLANT
SUT PLAIN 0 NONE (SUTURE) IMPLANT
SUT PLAIN 2 0 (SUTURE) ×2
SUT PLAIN ABS 2-0 CT1 27XMFL (SUTURE) ×1 IMPLANT
SUT VIC AB 0 CT1 27 (SUTURE) ×4
SUT VIC AB 0 CT1 27XBRD ANBCTR (SUTURE) ×2 IMPLANT
SUT VIC AB 2-0 CT1 27 (SUTURE) ×4
SUT VIC AB 2-0 CT1 TAPERPNT 27 (SUTURE) ×2 IMPLANT
SUT VIC AB 4-0 KS 27 (SUTURE) ×3 IMPLANT
SUT VICRYL 0 TIES 12 18 (SUTURE) IMPLANT
TOWEL OR 17X24 6PK STRL BLUE (TOWEL DISPOSABLE) ×3 IMPLANT
TRAY FOLEY CATH 14FR (SET/KITS/TRAYS/PACK) IMPLANT
WATER STERILE IRR 1000ML POUR (IV SOLUTION) ×3 IMPLANT

## 2014-07-13 NOTE — Plan of Care (Signed)
Problem: Phase I Progression Outcomes Goal: Assess per MD/Nurse,Routine-VS,FHR,UC,Head to Toe assess Outcome: Completed/Met Date Met:  07/13/14 Goal: Obtain and review prenatal records Outcome: Completed/Met Date Met:  07/13/14 Goal: Pain controlled with appropriate interventions Outcome: Completed/Met Date Met:  07/13/14 Goal: OOB as tolerated unless otherwise ordered Outcome: Completed/Met Date Met:  07/13/14 Goal: Tolerating diet Outcome: Completed/Met Date Met:  07/13/14 Goal: Adequate progression of labor Outcome: Completed/Met Date Met:  07/13/14 Goal: Medications/IV Fluids N/A Outcome: Completed/Met Date Met:  07/13/14 Goal: Induction meds as ordered Outcome: Completed/Met Date Met:  07/13/14 Goal: IV Pain medications as ordered Outcome: Completed/Met Date Met:  07/13/14 Goal: Pitocin as ordered Outcome: Completed/Met Date Met:  07/13/14 Goal: FHR checked 5 minutes after meds (ROM) Rupture of Membranes Outcome: Completed/Met Date Met:  07/13/14 Goal: Assess/evaluate labor progress and adequacy Outcome: Completed/Met Date Met:  07/13/14 Goal: Assess/evaluate cervical exam prn (q2hrs in active phase) Outcome: Completed/Met Date Met:  07/13/14

## 2014-07-13 NOTE — Lactation Note (Signed)
This note was copied from the chart of Regina [name]. Lactation Consultation Note New mom saw in PACU per request by RN d/t flat nipples and fussy baby. STS in PACU. Hand pump attempted to  Pull flat nipple out and unable to at this time. Also mom laying as well. Did some stimulation at breast w/manual pump, no colostrum noted. Hand expression taught. Fitted w/#20 NS and latched baby to suckle. No colostrum noted in NS. Baby appeared satisfied. Mom has DEBP. Explained I would set one up in rm. And visit her again.  Patient Name: Regina French Today's Date: 07/13/2014 Reason for consult: Initial assessment   Maternal Data Has patient been taught Hand Expression?: Yes Does the patient have breastfeeding experience prior to this delivery?: No  Feeding Feeding Type: Breast Fed Length of feed: 15 min  LATCH Score/Interventions Latch: Repeated attempts needed to sustain latch, nipple held in mouth throughout feeding, stimulation needed to elicit sucking reflex. Intervention(s): Skin to skin;Teach feeding cues;Waking techniques Intervention(s): Adjust position;Assist with latch;Breast massage;Breast compression  Audible Swallowing: None Intervention(s): Skin to skin;Hand expression  Type of Nipple: Flat Intervention(s): No intervention needed;Reverse pressure;Shells;Hand pump;Double electric pump  Comfort (Breast/Nipple): Soft / non-tender     Hold (Positioning): Full assist, staff holds infant at breast  LATCH Score: 4  Lactation Tools Discussed/Used Tools: Shells;Nipple Jefferson Fuel;Pump Nipple shield size: 20 Shell Type: Inverted Breast pump type: Double-Electric Breast Pump Pump Review: Setup, frequency, and cleaning;Milk Storage Initiated by:: Allayne Stack RN Date initiated:: 07/13/14   Consult Status Consult Status: Follow-up Date: 07/13/14 Follow-up type: In-patient    Theodoro Kalata 07/13/2014, 2:02 PM

## 2014-07-13 NOTE — Anesthesia Postprocedure Evaluation (Signed)
  Anesthesia Post-op Note  Anesthesia Post Note  Patient: Regina French  Procedure(s) Performed: Procedure(s) (LRB): CESAREAN SECTION (N/A)  Anesthesia type: Epidural  Patient location: PACU  Post pain: Pain level controlled  Post assessment: Post-op Vital signs reviewed  Post vital signs: stable  Level of consciousness: awake  Complications: No apparent anesthesia complications

## 2014-07-13 NOTE — Lactation Note (Signed)
This note was copied from the chart of Regina [name]. Lactation Consultation Note Set up DEBP in rm, fitted #24 flanges. unable to demonstrate cleaning d/t company coming in and mom very sleepy. Hand expression before company arrive w/no colostrum noted. Demonstrated NS application. Shells given and applied to be worn between feedings. Mom has on bra. Discussed feeding positions and how often to attempt. Unable to review booklet and brochure at this time. Left at bedside. Patient Name: Regina French [id number] Date: 07/13/2014 Reason for consult: Follow-up assessment   Maternal Data Has patient been taught Hand Expression?: Yes Does the patient have breastfeeding experience prior to this delivery?: No  Feeding Feeding Type: Breast Fed Length of feed: 15 min  LATCH Score/Interventions Latch: Repeated attempts needed to sustain latch, nipple held in mouth throughout feeding, stimulation needed to elicit sucking reflex. Intervention(s): Skin to skin;Teach feeding cues;Waking techniques Intervention(s): Adjust position;Assist with latch;Breast massage;Breast compression  Audible Swallowing: None Intervention(s): Skin to skin;Hand expression  Type of Nipple: Flat Intervention(s): No intervention needed;Reverse pressure;Shells;Hand pump;Double electric pump  Comfort (Breast/Nipple): Soft / non-tender     Hold (Positioning): Full assist, staff holds infant at breast  LATCH Score: 4  Lactation Tools Discussed/Used Tools: Shells;Nipple Jefferson Fuel;Pump Nipple shield size: 20 Shell Type: Inverted Breast pump type: Double-Electric Breast Pump Pump Review: Setup, frequency, and cleaning;Milk Storage Initiated by:: Allayne Stack RN Date initiated:: 07/13/14   Consult Status Consult Status: Follow-up Date: 07/13/14 Follow-up type: In-patient    Theodoro Kalata 07/13/2014, 2:07 PM

## 2014-07-13 NOTE — Consult Note (Signed)
Neonatology Note:   Attendance at C-section:    I was asked by Dr. Benjie Karvonen to attend this primary C/S at term due to [facility]. The mother is a G1P0 O pos, GBS neg with left ovarian dermoid and a history of HSV, on Valtrex. Abnormal glucola twice, but 3 hour GTT normal. ROM 15 hours prior to delivery, fluid clear. Infant vigorous with good spontaneous cry and tone. Needed no suctioning. Ap 9/9. Lungs clear to ausc in DR. To CN to care of Pediatrician.   Real Cons, MD

## 2014-07-13 NOTE — Progress Notes (Signed)
Gustie Bobb is a 28 y.o. G1P0 at [redacted]w[redacted]d , pt is exhausted, cannot sleep due to back pain. On Low dose pitocin as fetal intolerance earlier, Tried all positions, Peanut ball, Sims, high Fowler etc to get her comfortable. Neck pain due to excessive epidural boluses. Patient wants C/section.   Exam VS stable, afebrile. FHT in category I. Toco q 3-4 min. Cx 8/100%/ -2 (still at -2) tight fit with caput and moulding. Cervical change noted but not in decent.   Offered turning pitocin down/ more epidural or 2nd placement (declined since working for [employer] jut not with back pain) and reassess in 2 hrs since cervical dilatation noted. Pt declined and wants to proceed with c-section, failed decent.   Risks/complications of surgery reviewed incl infection, bleeding, damage to internal organs including bladder, bowels, ureters, blood vessels, other risks from anesthesia, VTE and delayed complications of any surgery, complications in future surgery reviewed. Also discussed neonatal complications incl difficult delivery, laceration, vacuum assistance, TTN etc. Pt understands and agrees, all concerns addressed.      Kierstyn Baranowski R 07/13/2014, 7:00 AM

## 2014-07-13 NOTE — Op Note (Signed)
Cesarean Section Procedure Note  Regina French  07/13/2014  Indications: Arrest of decent and rotation    Procedure: Primary Low Transverse Cesarean section                      Left ovarian dermoid cystectomy   Pre-operative Diagnosis: Arrest of descent. Left ovarian dermoid 6 cm   Post-operative Diagnosis: Same   Surgeon:  Elveria Royals, MD  Assistants: Laury Deep, CNM  Anesthesia: epidural (2nd epidural placed in OR since 1st was not effective on right side after bolusing.)   Procedure Details:  The patient was seen in the Labor Room. The risks, benefits, complications, treatment options, and expected outcomes were discussed with the patient. The patient concurred with the proposed plan, giving informed consent. identified as Regina French and the procedure verified as C-Section Delivery. A Time Out was held and the above information confirmed.  After induction of anesthesia, the patient was draped and prepped in the usual sterile manner. A Pfannenstiel Incision was made and carried down through the subcutaneous tissue to the fascia. Fascial incision was made and extended transversely. The fascia was separated from the underlying rectus tissue superiorly and inferiorly. The peritoneum was identified and entered. Peritoneal incision was extended longitudinally. Alexis-O retractor was placed. The utero-vesical peritoneal reflection was incised transversely and the bladder flap was bluntly freed from the lower uterine segment. A low transverse uterine incision was made. Delivered from cephalic LOT position was  FEMALE infant at 8.34 am on 07/13/14  with Apgar scores of 9 at one minute and 9 at five minutes. Cord ph was not sent the umbilical cord was clamped and cut cord blood was obtained for evaluation. The placenta was removed Intact and appeared normal. Lower segment and hysterotomy edges were bleeding actively and massage and placing ring clamps helped.  Small extension noted on right  side but 2 cm long. The uterine incision was closed with running locked sutures of 0-Monocryl in two layers, second imbricating the first. Hemostasis was observed.  Left ovarian dermoid that was noted since first trimester was evaluated. Moist Lap sponges packed around the ovary. Patient gave consent to perform ovarian cystectomy. Linear incision made on left ovary and copious thick yellow fatty secretion and tuft of hair removed. Ovarian cyst wall identified and gently pealed off from the ovary. Hemostasis was achieved with cautery. Surgicel piece placed and then ovarian capsule closed with 3-0 Monocryl. Hemostasis was excellent. Right ovary and both the fallopian tubes appeared normal.  Irrigation performed. Alexis-O retractor was removed. 10 cc 1% Nesacaine instilled in the peritoneal cavity due to pain with peritoneal pull. 2-0 Vicryl used to close peritoneum. Rectus muscles approximated in lower area. The fascia was then reapproximated with running sutures of 0Vicryl. The subcuticular closure was performed using 2-0plain gut. The skin was closed with 4-0Vicryl. Sterile dressing placed.   Instrument, sponge, and needle counts were correct prior the abdominal closure and were correct at the conclusion of the case.   Findings: Arrest of decent. LOT position, delivered cephalic via Buddy Duty Hysterotomy. Female infant delivered at 8.34 am on 07/13/14 with Apgars 9 and 9. Left ovarian 6 cm dermoid, cystectomy performed, right ovary normal.    Estimated Blood Loss: 1700 cc  Total IV Fluids: 3700 ml LR  Urine Output: 450CC OF clear urine  Specimens: Cord blood, left ovarian dermoid cyst wall  Complications: no complications  Disposition: PACU - hemodynamically stable.   Maternal Condition: stable   Baby condition /  location:  Couplet care / Skin to Skin  Attending Attestation: I performed the procedure.   Signed: Surgeon(s): Elveria Royals, MD

## 2014-07-13 NOTE — Transfer of Care (Signed)
Immediate Anesthesia Transfer of Care Note  Patient: Regina French  Procedure(s) Performed: Procedure(s): CESAREAN SECTION (N/A)  Patient Location: PACU  Anesthesia Type:Epidural  Level of Consciousness: awake, alert , oriented and patient cooperative  Airway & Oxygen Therapy: Patient Spontanous Breathing  Post-op Assessment: Report given to PACU RN and Post -op Vital signs reviewed and stable  Post vital signs: Reviewed and stable  Complications: No apparent anesthesia complications;labs to be ordered by surgeon for 5pm

## 2014-07-13 NOTE — Progress Notes (Signed)
Regina French is a 28 y.o. G1P0 at [redacted]w[redacted]d IOL per request.  Took a while to get comfortable with epidural. Now better.   Objective: BP 133/79 mmHg  Pulse 78  Temp(Src) 97.8 F (36.6 C) (Oral)  Resp 20  Ht 5\' 4"  (1.626 m)  Wt 180 lb (81.647 kg)  BMI 30.88 kg/m2  SpO2 100%   FHT:  FHR: 120s bpm, variability: moderate,  accelerations:  Present,  decelerations:  Present intermittent variable decels with some late decels, improved after pitocin turned off UC:   regular, every 2-4 minutes and spaced out to q 3-5 min since pitocin off SVE:   Dilation: 4.5 Effacement (%): 100 Station: -2 Exam by:: Dr Benjie Karvonen Caput noted. Effacement better, put in exaggerated Sims or on peanut ball to allow flexion/ decent.  Assessment / Plan: Protracted latent phase and no decent, borderline pelvis probably the reason. FHT category II with pitocin and now category I since pitocin turned off. Allow FHT recovery and restart low dose pitocin. Increased c/section possibility but continue to assess for vaginal birth.    Clarise Chacko R 07/13/2014, 12:16 AM

## 2014-07-13 NOTE — Plan of Care (Signed)
Problem: Phase I Progression Outcomes Goal: Foley catheter patent Outcome: Completed/Met Date Met:  07/13/14 Goal: OOB as tolerated unless otherwise ordered Outcome: Completed/Met Date Met:  07/13/14 Goal: IS, TCDB as ordered Outcome: Completed/Met Date Met:  07/13/14

## 2014-07-13 NOTE — Lactation Note (Signed)
This note was copied from the chart of Girl [name]. Lactation Consultation Note  Patient Name: Girl Kennice Finnie Today's Date: 07/13/2014 Reason for consult: Initial assessment   Maternal Data Has patient been taught Hand Expression?: Yes Does the patient have breastfeeding experience prior to this delivery?: No  Feeding Feeding Type: Breast Fed Length of feed: 15 min  LATCH Score/Interventions Latch: Repeated attempts needed to sustain latch, nipple held in mouth throughout feeding, stimulation needed to elicit sucking reflex. Intervention(s): Skin to skin;Teach feeding cues;Waking techniques Intervention(s): Adjust position;Assist with latch;Breast massage;Breast compression  Audible Swallowing: None Intervention(s): Skin to skin;Hand expression  Type of Nipple: Flat Intervention(s): No intervention needed;Reverse pressure;Shells;Hand pump;Double electric pump  Comfort (Breast/Nipple): Soft / non-tender     Hold (Positioning): Full assist, staff holds infant at breast  LATCH Score: 4  Lactation Tools Discussed/Used Tools: Shells;Nipple Jefferson Fuel;Pump Nipple shield size: 20 Shell Type: Inverted Breast pump type: Double-Electric Breast Pump Pump Review: Setup, frequency, and cleaning;Milk Storage Initiated by:: Allayne Stack RN Date initiated:: 07/13/14   Consult Status Consult Status: Follow-up Date: 07/13/14 Follow-up type: In-patient    Theodoro Kalata 07/13/2014, 1:41 PM

## 2014-07-14 ENCOUNTER — Encounter (HOSPITAL_COMMUNITY): Payer: Self-pay | Admitting: Obstetrics & Gynecology

## 2014-07-14 LAB — CBC
HEMATOCRIT: 25.7 % — AB (ref 36.0–46.0)
HEMOGLOBIN: 8.9 g/dL — AB (ref 12.0–15.0)
MCH: 31.8 pg (ref 26.0–34.0)
MCHC: 34.6 g/dL (ref 30.0–36.0)
MCV: 91.8 fL (ref 78.0–100.0)
Platelets: 137 10*3/uL — ABNORMAL LOW (ref 150–400)
RBC: 2.8 MIL/uL — ABNORMAL LOW (ref 3.87–5.11)
RDW: 13.2 % (ref 11.5–15.5)
WBC: 16.6 10*3/uL — AB (ref 4.0–10.5)

## 2014-07-14 LAB — BIRTH TISSUE RECOVERY COLLECTION (PLACENTA DONATION)

## 2014-07-14 MED ORDER — MAGNESIUM OXIDE 400 (241.3 MG) MG PO TABS
200.0000 mg | ORAL_TABLET | Freq: Two times a day (BID) | ORAL | Status: DC
  Administered 2014-07-14 – 2014-07-16 (×5): 200 mg via ORAL
  Filled 2014-07-14 (×5): qty 0.5

## 2014-07-14 MED ORDER — POLYSACCHARIDE IRON COMPLEX 150 MG PO CAPS
150.0000 mg | ORAL_CAPSULE | Freq: Two times a day (BID) | ORAL | Status: DC
  Administered 2014-07-14 – 2014-07-16 (×5): 150 mg via ORAL
  Filled 2014-07-14 (×5): qty 1

## 2014-07-14 NOTE — Addendum Note (Signed)
Addendum  created 07/14/14 0914 by Tobin Chad, CRNA   Modules edited: Notes Section   Notes Section:  File: [id number]

## 2014-07-14 NOTE — Lactation Note (Signed)
This note was copied from the chart of Regina [name]. Lactation Consultation Note  Patient Name: Regina French [id number] Date: 07/14/2014 Reason for consult: Follow-up assessment Baby 27 hours of life. Mom reports nipple pain and baby fussy at breast. Assisted mom to hand express colostrum from both breast. Enc mom to use EBM after nursing to help with healing/soreness. Assisted mom to latch baby in football position to right breast, mom reports nipple pain. Assisted mom to latch baby in cross-cradle position, mom states pain continued. Re-fitted mom with #24 NS and re-latched baby to right breast in football position. Mom reports increased comfort. Baby latches deeply, suckles rhythmically with lips flanged and a few swallows noted. Colostrum is visible in NS. Discussed cluster-feeding with mom and enc feeding with cues. Discussed need to attempt to latch without NS and that NS is a temporary measure. Mom aware of OP appointments and need to watch baby's weights closely. Enc mom to call out for assistance as needed.   Maternal Data    Feeding Feeding Type: Breast Fed Length of feed: 10 min  LATCH Score/Interventions Latch: Grasps breast easily, tongue down, lips flanged, rhythmical sucking. Intervention(s): Assist with latch;Adjust position  Audible Swallowing: A few with stimulation Intervention(s): Hand expression  Type of Nipple: Flat Intervention(s): Double electric pump;Shells (shield)  Comfort (Breast/Nipple): Filling, red/small blisters or bruises, mild/mod discomfort  Problem noted: Mild/Moderate discomfort Interventions (Mild/moderate discomfort): Comfort gels  Hold (Positioning): Assistance needed to correctly position infant at breast and maintain latch. Intervention(s): Breastfeeding basics reviewed;Support Pillows;Position options  LATCH Score: 6  Lactation Tools Discussed/Used Tools: Nipple Shields;Pump;Comfort gels Nipple shield size: 24 Breast pump type:  Double-Electric Breast Pump   Consult Status Consult Status: Follow-up Date: 07/15/14 Follow-up type: In-patient    Inocente Salles 07/14/2014, 11:57 AM

## 2014-07-14 NOTE — Anesthesia Postprocedure Evaluation (Signed)
  Anesthesia Post-op Note  Patient: Regina French  Procedure(s) Performed: Procedure(s): CESAREAN SECTION (N/A)  Patient Location: Mother/Baby  Anesthesia Type:Epidural  Level of Consciousness: awake  Airway and Oxygen Therapy: Patient Spontanous Breathing  Post-op Pain: none  Post-op Assessment: Patient's Cardiovascular Status Stable, Respiratory Function Stable, Patent Airway, No signs of Nausea or vomiting, Adequate PO intake, Pain level controlled and No headache  Post-op Vital Signs: Reviewed and stable  Last Vitals:  Filed Vitals:   07/14/14 0750  BP: 106/55  Pulse: 61  Temp: 36.7 C  Resp: 16    Complications: No apparent anesthesia complications

## 2014-07-14 NOTE — Plan of Care (Signed)
Problem: Discharge Progression Outcomes Goal: Activity appropriate for discharge plan Outcome: Completed/Met Date Met:  07/14/14 Goal: Tolerating diet Outcome: Completed/Met Date Met:  07/14/14

## 2014-07-14 NOTE — Progress Notes (Signed)
POSTOPERATIVE DAY # 1S/P C/S for arrest of descent and rotation and excision of Left ovarian dermoid cyst   S:         Reports feeling good, some soreness with activity, about to take a shower             Tolerating po intake / no nausea / no vomiting / no flatus / no BM             Bleeding is light             Pain controlled withMotrin and Percocet             Up ad lib / ambulatory/ voiding QS  Newborn breast feeding - using nipple shields, having some pain and tenderness with latching   O:  VS: BP 106/55 mmHg  Pulse 61  Temp(Src) 98.1 F (36.7 C) (Oral)  Resp 16  Ht 5\' 4"  (1.626 m)  Wt 81.647 kg (180 lb)  BMI 30.88 kg/m2  SpO2 100%  Breastfeeding? Unknown   LABS:               Recent Labs  07/13/14 1620 07/14/14 0544  WBC 18.2* 16.6*  HGB 9.7* 8.9*  PLT 160 137*               Bloodtype: --/--/O POS, O POS (12/07 0705)  Rubella: Immune (04/29 0000)                                             I&O: Intake/Output      12/08 0701 - 12/09 0700 12/09 0701 - 12/10 0700   P.O. 1540    I.V. (mL/kg) 4402.5 (53.9)    Total Intake(mL/kg) 5942.5 (72.8)    Urine (mL/kg/hr) 3900 (2) 250 (0.9)   Blood 1700 (0.9)    Total Output 5600 250   Net +342.5 -250                     Physical Exam:             Alert and Oriented X3  Lungs: Clear and unlabored  Heart: regular rate and rhythm / no mumurs  Breasts: nipples flat with some erythema, no cracking, no bleeding             Abdomen: soft, non-tender, non-distended, active bowel sounds in all 4 quadrants             Fundus: firm, non-tender, U-1             Dressing: pressure dressing on, able to peel back to view honeycomb dressing - saturated with serosanginous drainage             Incision:  approximated with sutures / no erythema / no ecchymosis / serosanginous drainage  Perineum: intact, no erythema, no ecchymosis, no edema  Lochia: small, bright red, no clots noted  Extremities: +1 dependent edema, no calf pain or  tenderness, negative Homans  A:        POD # 1 S/P C/S for arrest of descent and rotation & excision of Left ovarian dermoid cyst            IDA with compounding ABL Anemia - begin Iron supplement            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

## 2014-07-14 NOTE — Plan of Care (Signed)
Problem: Phase II Progression Outcomes Goal: Pain controlled on oral analgesia Outcome: Completed/Met Date Met:  07/14/14 Goal: Progress activity as tolerated unless otherwise ordered Outcome: Completed/Met Date Met:  07/14/14 Goal: Afebrile, VS remain stable Outcome: Completed/Met Date Met:  07/14/14 Goal: Tolerating diet Outcome: Completed/Met Date Met:  07/14/14

## 2014-07-14 NOTE — Discharge Instructions (Signed)
Iron-Rich Diet An iron-rich diet contains foods that are good sources of iron. Iron is an important mineral that helps your body produce hemoglobin. Hemoglobin is a protein in red blood cells that carries oxygen to the body's tissues. Sometimes, the iron level in your blood can be low. This may be caused by:  A lack of iron in your diet.  Blood loss.  Times of growth, such as during pregnancy or during a child's growth and development. Low levels of iron can cause a decrease in the number of red blood cells. This can result in iron deficiency anemia. Iron deficiency anemia symptoms include:  Tiredness.  Weakness.  Irritability.  Increased chance of infection. Here are some recommendations for daily iron intake:  Males older than 30 years of age need 8 mg of iron per day.  Women ages 19 to 50 need 18 mg of iron per day.  Pregnant women need 27 mg of iron per day, and women who are over 19 years of age and breastfeeding need 9 mg of iron per day.  Women over the age of 50 need 8 mg of iron per day. SOURCES OF IRON There are 2 types of iron that are found in food: heme iron and nonheme iron. Heme iron is absorbed by the body better than nonheme iron. Heme iron is found in meat, poultry, and fish. Nonheme iron is found in grains, beans, and vegetables. Heme Iron Sources Food / Iron (mg)  Chicken liver, 3 oz (85 g)/ 10 mg  Beef liver, 3 oz (85 g)/ 5.5 mg  Oysters, 3 oz (85 g)/ 8 mg  Beef, 3 oz (85 g)/ 2 to 3 mg  Shrimp, 3 oz (85 g)/ 2.8 mg  Turkey, 3 oz (85 g)/ 2 mg  Chicken, 3 oz (85 g) / 1 mg  Fish (tuna, halibut), 3 oz (85 g)/ 1 mg  Pork, 3 oz (85 g)/ 0.9 mg Nonheme Iron Sources Food / Iron (mg)  Ready-to-eat breakfast cereal, iron-fortified / 3.9 to 7 mg  Tofu,  cup / 3.4 mg  Kidney beans,  cup / 2.6 mg  Baked potato with skin / 2.7 mg  Asparagus,  cup / 2.2 mg  Avocado / 2 mg  Dried peaches,  cup / 1.6 mg  Raisins,  cup / 1.5 mg  Soy milk, 1 cup  / 1.5 mg  Whole-wheat bread, 1 slice / 1.2 mg  Spinach, 1 cup / 0.8 mg  Broccoli,  cup / 0.6 mg IRON ABSORPTION Certain foods can decrease the body's absorption of iron. Try to avoid these foods and beverages while eating meals with iron-containing foods:  Coffee.  Tea.  Fiber.  Soy. Foods containing vitamin C can help increase the amount of iron your body absorbs from iron sources, especially from nonheme sources. Eat foods with vitamin C along with iron-containing foods to increase your iron absorption. Foods that are high in vitamin C include many fruits and vegetables. Some good sources are:  Fresh orange juice.  Oranges.  Strawberries.  Mangoes.  Grapefruit.  Red bell peppers.  Green bell peppers.  Broccoli.  Potatoes with skin.  Tomato juice. Document Released: 03/06/2005 Document Revised: 10/15/2011 Document Reviewed: 01/11/2011 Exit[employer] Patient Information 2015 [employer]. This information is not intended to replace advice given to you by your health care provider. Make sure you discuss any questions you have with your health care provider.  

## 2014-07-14 NOTE — Plan of Care (Signed)
Problem: Phase I Progression Outcomes Goal: Voiding adequately Outcome: Completed/Met Date Met:  07/14/14     

## 2014-07-14 NOTE — Plan of Care (Signed)
Problem: Phase I Progression Outcomes Goal: Pain controlled with appropriate interventions Outcome: Completed/Met Date Met:  07/14/14 Goal: VS, stable, temp < 100.4 degrees F Outcome: Completed/Met Date Met:  07/14/14 Goal: Initial discharge plan identified Outcome: Completed/Met Date Met:  07/14/14

## 2014-07-15 NOTE — Progress Notes (Signed)
POSTOPERATIVE DAY # 2S/P C/S for arrest of descent and rotation and excision of Left ovarian dermoid cyst   S:         Reports feeling good, some soreness with activity, about to take a shower             Tolerating po intake / no nausea / no vomiting / small flatus / no BM             Bleeding is light             Pain controlled with Motrin and Percocet             Up ad lib / ambulatory/ voiding QS  Newborn breast feeding - using nipple shields, having some pain and tenderness with latching   O:  VS: BP 104/56 mmHg  Pulse 69  Temp(Src) 98 F (36.7 C) (Oral)  Resp 17  Ht 5\' 4"  (1.626 m)  Wt 81.647 kg (180 lb)  BMI 30.88 kg/m2  SpO2 100%  Breastfeeding   LABS:                Recent Labs  07/13/14 1620 07/14/14 0544  WBC 18.2* 16.6*  HGB 9.7* 8.9*  PLT 160 137*               Bloodtype: O POS (12/07 0705)  Rubella: Immune (04/29 0000)                                             I&O: Intake/Output      12/09 0701 - 12/10 0700 12/10 0701 - 12/11 0700   P.O. 240    I.V. (mL/kg)     Total Intake(mL/kg) 240 (2.9)    Urine (mL/kg/hr) 250 (0.1)    Blood     Total Output 250     Net -10                       Physical Exam:             Alert and Oriented X3  Lungs: Clear and unlabored  Heart: regular rate and rhythm / no mumurs  Breasts: nipples flat with some erythema, no cracking, no bleeding             Abdomen: soft, non-tender, non-distended, active bowel sounds in all 4 quadrants             Fundus: firm, non-tender, U-2             Dressing: Tegaderm and Honeycomb C/D/I - some dried serosanginous drainage noted on underlying  steristrips             Incision:  approximated with sutures / no erythema / no ecchymosis   Perineum: intact, no erythema, no ecchymosis, no edema  Lochia: small, bright red, no clots noted  Extremities: +1 dependent edema, no calf pain or tenderness, negative Homans  A:        POD # 2 S/P C/S for arrest of descent and rotation &  excision of Left ovarian dermoid cyst            IDA with compounding ABL Anemia             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

## 2014-07-15 NOTE — Lactation Note (Signed)
This note was copied from the chart of Regina [name]. Lactation Consultation Note Dad sleeping, mom BF baby. Would like someone to show her how to work her DEBP before d/c home. Dad needs to see it so would like someone to come back.  Baby had 8% weight loss, cluster feeding during the night, mom using NS #24. Has shells and DEBP. I feel weight loss might be coming from the great amount of pee's and poop's since birth. Mom states feeding going well. Patient Name: Regina French [id number] Date: 07/15/2014 Reason for consult: Follow-up assessment;Infant weight loss   Maternal Data    Feeding Feeding Type: Breast Fed  LATCH Score/Interventions Latch: Grasps breast easily, tongue down, lips flanged, rhythmical sucking. Intervention(s): Breast compression;Breast massage  Audible Swallowing: Spontaneous and intermittent  Type of Nipple: Flat Intervention(s): Shells;Double electric pump  Comfort (Breast/Nipple): Filling, red/small blisters or bruises, mild/mod discomfort  Problem noted: Mild/Moderate discomfort Interventions (Mild/moderate discomfort): Breast shields;Post-pump;Pre-pump if needed;Comfort gels  Hold (Positioning): No assistance needed to correctly position infant at breast.  LATCH Score: 8  Lactation Tools Discussed/Used Tools: Nipple Jefferson Fuel;Shells;Pump Nipple shield size: 24 Shell Type: Inverted Breast pump type: Double-Electric Breast Pump   Consult Status Consult Status: Follow-up Date: 07/15/14 Follow-up type: In-patient    Regina French, Elta [place] 07/15/2014, 7:15 AM

## 2014-07-15 NOTE — Lactation Note (Signed)
This note was copied from the chart of Regina [name]. Lactation Consultation Note  Follow up visit made.  Mom states baby is latching and nursing well using shield.  Baby cluster fed during the night.  Baby is currently on mom's lap sleeping.  Reviewed waking techniques and breast massage.  Encouraged to call with concerns/latch assist prn.  Patient Name: Regina French Today's Date: 07/15/2014     Maternal Data    Feeding Feeding Type: Breast Fed Length of feed: 15 min  LATCH Score/Interventions Latch: Repeated attempts needed to sustain latch, nipple held in mouth throughout feeding, stimulation needed to elicit sucking reflex.  Audible Swallowing: None  Type of Nipple: Flat  Comfort (Breast/Nipple): Soft / non-tender     Hold (Positioning): No assistance needed to correctly position infant at breast.  LATCH Score: 6  Lactation Tools Discussed/Used Tools: Nipple Shields Nipple shield size: 24 Shell Type: Inverted Breast pump type: Double-Electric Breast Pump   Consult Status      Ave Filter 07/15/2014, 2:01 PM

## 2014-07-15 NOTE — Plan of Care (Signed)
Problem: Phase II Progression Outcomes Goal: Incision intact & without signs/symptoms of infection Outcome: Completed/Met Date Met:  07/15/14 Goal: Other Phase II Outcomes/Goals Outcome: Not Applicable Date Met:  07/15/14     

## 2014-07-16 ENCOUNTER — Encounter (HOSPITAL_COMMUNITY): Payer: Self-pay

## 2014-07-16 DIAGNOSIS — R609 Edema, unspecified: Secondary | ICD-10-CM

## 2014-07-16 HISTORY — DX: Edema, unspecified: R60.9

## 2014-07-16 MED ORDER — POLYSACCHARIDE IRON COMPLEX 150 MG PO CAPS: 150.0000 mg | ORAL_CAPSULE | Freq: Two times a day (BID) | ORAL | Status: DC

## 2014-07-16 MED ORDER — MAGNESIUM OXIDE 400 (241.3 MG) MG PO TABS: 200.0000 mg | ORAL_TABLET | Freq: Two times a day (BID) | ORAL | Status: DC

## 2014-07-16 MED ORDER — HYDROCHLOROTHIAZIDE 12.5 MG PO TABS: 12.5000 mg | ORAL_TABLET | Freq: Every day | ORAL | Status: DC

## 2014-07-16 MED ORDER — IBUPROFEN 600 MG PO TABS: 600.0000 mg | ORAL_TABLET | Freq: Four times a day (QID) | ORAL | Status: DC

## 2014-07-16 MED ORDER — OXYCODONE-ACETAMINOPHEN 5-325 MG PO TABS: 1.0000 | ORAL_TABLET | ORAL | Status: DC | PRN

## 2014-07-16 NOTE — Plan of Care (Signed)
Problem: Discharge Progression Outcomes Goal: Pain controlled with appropriate interventions Outcome: Completed/Met Date Met:  07/16/14

## 2014-07-16 NOTE — Progress Notes (Addendum)
POSTOPERATIVE DAY # 3 S/P C/S for arrest of descent and rotation and excision of Left ovarian dermoid cyst   S:         Reports feeling good, some soreness with activity, about to take a shower             Tolerating po intake / no nausea / no vomiting / small flatus / no BM             Bleeding is light             Pain controlled with Motrin and Percocet             Up ad lib / ambulatory/ voiding QS  Newborn breast feeding - using nipple shields, having some pain and tenderness with latching   O:  VS: BP 104/56 mmHg  Pulse 69  Temp(Src) 98 F (36.7 C) (Oral)  Resp 17  Ht 5\' 4"  (1.626 m)  Wt  81.647 kg (180 lb)  BMI 30.88 kg/m2  SpO2 100%  Breastfeeding   LABS:                Recent Labs  07/13/14 1620 07/14/14 0544  WBC 18.2* 16.6*  HGB 9.7* 8.9*  PLT 160 137*               Bloodtype: O POS (12/07 0705)  Rubella: Immune (04/29 0000)                                             I&O: Intake/Output      12/10 0701 - 12/11 0700 12/11 0701 - 12/12 0700   P.O.     Total Intake(mL/kg)     Urine (mL/kg/hr)     Total Output       Net                         Physical Exam:             Alert and Oriented X3  Lungs: Clear and unlabored  Heart: regular rate and rhythm / no mumurs  Breasts: nipples flat with some erythema, no cracking, no bleeding             Abdomen: soft, non-tender, non-distended, active bowel sounds in all 4 quadrants             Fundus: firm, non-tender, U-2             Dressing: Tegaderm and Honeycomb C/D/I - small dried serosanginous drainage noted on underlying  steristrips             Incision:  approximated with sutures / no erythema / no ecchymosis   Perineum: intact, no erythema, no ecchymosis, no edema  Lochia: small, bright red, no clots noted  Extremities: +2 dependent edema, no calf pain or tenderness, negative Homans  A:        POD # 3 / S/P C/S for arrest of descent and rotation & excision of Left ovarian dermoid cyst            IDA  with compounding ABL Anemia             HSV 2 - received prophylaxis Valtrex  Dependent Edema  P:        Routine postoperative care   Continue Niferex 150mg  daily -  will plan on BID at discharge for 2 weeks, then daily for 4 weeks  Continue Magnesium Oxide 200mg  daily  Start HCTZ 12.5 mg daily x 7 days   D/C Home today              Graceann Congress MSN, CNM 07/16/2014 10:09 AM

## 2014-07-16 NOTE — Discharge Summary (Signed)
POSTOPERATIVE DISCHARGE SUMMARY:  Patient ID: Regina French MRN: [id number] DOB/AGE: 1985/08/14 28 y.o.  Admit date: 07/12/2014 Admission Diagnoses: IOL at term for increasing discomforts of pregnancy   Discharge date:  07/16/2014 Discharge Diagnoses: S/P Primary C/S due to arrest of dilation and fetal head rotation on 07/13/2014        Prenatal history: G1P1001   EDC : 07/12/2014, by Ultrasound  Has received prenatal care at [facility] since 8.[redacted] wks gestation. Primary provider : Dr. Benjie Karvonen Prenatal course complicated by bleeding in early pregnancy / abnormal 1 hr GTT / (+) HSV  Prenatal Labs: ABO, Rh: --/--/O POS, O POS (12/07 0705)  Antibody: NEG (12/07 0705) Rubella: Immune (04/29 0000)   RPR: NON REAC (12/07 0705)  HBsAg: Negative (04/29 0000)  HIV: Non-reactive (04/29 0000)  GTT : Abnormal 1 hr GTT x 2 / Normal 3 hr GTT GBS: Negative (11/24 0000)   Medical / Surgical History :  Past medical history:  Past Medical History  Diagnosis Date  . Substance abuse   . Herpes   . Headache   . Hx of varicella   . Postpartum care following cesarean delivery with excision of LT dermoid cyst (12/8) 07/13/2014    Past surgical history:  Past Surgical History  Procedure Laterality Date  . No past surgeries    . Cesarean section N/A 07/13/2014    Procedure: CESAREAN SECTION;  Surgeon: Elveria Royals, MD;  Location: [facility];  Service: Obstetrics;  Laterality: N/A;     Allergies: Penicillins   Intrapartum Course:  Admitted for induction of labor / pitocin induction / epidural for pain management / normal progression to 8 cm / arrest of dilation at 8 cm / arrest of fetal head rotation / decision for cesarean delivery made after discussion between Dr. Benjie Karvonen and patient / at start of case patient did not pass test of effective anesthesia with indwelling epidural / it was decided at that time by Dr. Lyndle Herrlich to remove the epidural and replace it with a new epidural  / drape removed and patient positioned for new epidural placement / after epidural placement, patient was allowed enough time to get adequately anesthetized / extensive testing was performed no no difficulties / Dr. Benjie Karvonen proceeded with cesarean delivery of viable female / no immediate postpartum complications noted.  Physical Exam:   VSS: Blood pressure 110/75, pulse 87, temperature 98.1 F (36.7 C), temperature source Oral, resp. rate 18, height 5\' 4"  (1.626 m), weight 81.647 kg (180 lb), SpO2 100 %, unknown if currently breastfeeding.  LABS:  Recent Labs  07/13/14 1620 07/14/14 0544  WBC 18.2* 16.6*  HGB 9.7* 8.9*  PLT 160 137*    Newborn Data Live born female  Birth Weight: 7 lb 7.9 oz (3399 g) APGAR: 9, 9  See operative report for further details  Home with mother.  Discharge Instructions:  Wound Care: keep clean and dry / remove honeycomb POD 7 Postpartum Instructions: [facility] discharge booklet - instructions reviewed Medications:    Medication List    TAKE these medications        acetaminophen 500 MG tablet  Commonly known as:  TYLENOL  Take 500 mg by mouth every 6 (six) hours as needed for moderate pain.     docusate sodium 100 MG capsule  Commonly known as:  COLACE  Take 100 mg by mouth 2 (two) times daily.     ibuprofen 600 MG tablet  Commonly known as:  ADVIL,MOTRIN  Take  1 tablet (600 mg total) by mouth every 6 (six) hours.     IRON PO  Take 1 tablet by mouth daily.     iron polysaccharides 150 MG capsule  Commonly known as:  NIFEREX  Take 1 capsule (150 mg total) by mouth 2 (two) times daily.     magnesium oxide 400 (241.3 MG) MG tablet  Commonly known as:  MAG-OX  Take 0.5 tablets (200 mg total) by mouth 2 (two) times daily.     oxyCODONE-acetaminophen 5-325 MG per tablet  Commonly known as:  PERCOCET/ROXICET  Take 1 tablet by mouth every 4 (four) hours as needed (for pain scale less than 7).     prenatal multivitamin Tabs tablet  Take 1  tablet by mouth daily at 12 noon.     valACYclovir 500 MG tablet  Commonly known as:  VALTREX  Take 500 mg by mouth daily.           Follow-up Information    Follow up with MODY,VAISHALI R, MD. Schedule an appointment as soon as possible for a visit in 6 weeks.   Specialty:  Obstetrics and Gynecology   Why:  postpartum visit   Contact information:   [address] [place] [address] [phone]         Signed: Graceann Congress, MSN, CNM 07/16/2014, 11:07 AM

## 2014-07-16 NOTE — Lactation Note (Signed)
This note was copied from the chart of Regina French. Lactation Consultation Note  Patient Name: Regina French Date: 07/16/2014 Reason for consult: Follow-up assessment;Difficult latch;Infant weight loss Mom called for assist with latch. Mom is using #24 nipple shield to latch baby to right breast. Both nipples flat with positional stripes. Some mild nipple discomfort reported. On exam, baby is noted to have short, posterior frenulum with limited tongue mobility lateral and upward. Lip sucking blister across upper lip. Mom started supplementing last night due to weight loss. Presently FOB is pre-loading the nipple shield with formula to get baby to develop a good suckling pattern. Using the 24 nipples shield, baby is not obtaining good depth, chewing/biting at the breast. Changed nipple shield to size 20 for better fit. Baby was able to obtain more depth, however has difficulty keeping the bottom lip flanged with feeding. Still some chewing at the breast noted.  Baby nursed for 15 minutes, scant amount of colostrum visible in the nipple shield. Parents interested in using SNS at the breast to supplement as LC has advised due to weight loss baby should be getting a minimum of 20 ml with each feeding, increasing as needed per guidelines given till baby's weight loss stabalizes. Set up/cleaning of SNS demonstrated. Baby transferred the 20 ml of Enfamil from the SNS well if the tubing was on the outside of the nipple shield. Plan discussed with parents: BF with feeding ques, but at least every 3 hours. Pre-pump to get milk flow going and to help nipple shield stay on well. Nurse on the 1st breast without the SNS, let baby stay actively nursing for 15-20 minutes then switch to the 2nd breast using the SNS to supplement. Each feeding Mom will alternate which breast she uses the SNS so that she does not use the SNS on the same breast each feeding. We want each breast to get good stimulation by not  supplementing on the 1st breast. Mom to post pump after feedings to encourage her milk production, prevent engorgement and protect milk supply. Engorgement care reviewed if needed. If this plan becomes overwhelming LC advised Mom that she could breastfeed, FOB could finger feed supplement while she pumps or she could use bottle if needed. OP f/u scheduled for Wednesday 07/21/14 at 1:00 pm. To call for questions or concerns. Care for sore nipples reviewed. Reviewed set up of DEBP.  Maternal Data    Feeding Feeding Type: Formula Length of feed: 15 min  LATCH Score/Interventions Latch: Grasps breast easily, tongue down, lips flanged, rhythmical sucking. (using #20 nipple shield) Intervention(s): Assist with latch  Audible Swallowing: Spontaneous and intermittent (with SNS/formula at breast)  Type of Nipple: Flat Intervention(s): Double electric pump  Comfort (Breast/Nipple): Filling, red/small blisters or bruises, mild/mod discomfort Problem noted: Cracked, bleeding, blisters, bruises Intervention(s): Expressed breast milk to nipple  Problem noted: Mild/Moderate discomfort Interventions (Mild/moderate discomfort): Comfort gels  Hold (Positioning): Assistance needed to correctly position infant at breast and maintain latch. Intervention(s): Breastfeeding basics reviewed;Support Pillows;Position options;Skin to skin  LATCH Score: 7  Lactation Tools Discussed/Used Tools: Pump;Supplemental Nutrition System;Comfort gels Nipple shield size: 20 Breast pump type: Double-Electric Breast Pump   Consult Status Consult Status: Complete Date: 07/16/14 Follow-up type: In-patient    Katrine Coho 07/16/2014, 11:25 AM

## 2014-07-21 ENCOUNTER — Ambulatory Visit (HOSPITAL_COMMUNITY): Payer: PRIVATE HEALTH INSURANCE | Attending: Obstetrics & Gynecology

## 2015-08-23 MED FILL — NUVARING VAGINAL RING: 0.12-0.015 | 27 days supply | Qty: 1 | Fill #1

## 2015-08-23 MED FILL — RIZATRIPTAN 10 MG ODT: 10 | 30 days supply | Qty: 5 | Fill #0

## 2015-09-05 DIAGNOSIS — G43909 Migraine, unspecified, not intractable, without status migrainosus: Secondary | ICD-10-CM | POA: Diagnosis not present

## 2015-09-05 MED FILL — RIZATRIPTAN 10 MG ODT: 10 | 30 days supply | Qty: 9 | Fill #0

## 2015-09-05 MED FILL — BUTALBITAL/APAP/CAFFEINE TB: 50-325-40 | 5 days supply | Qty: 15 | Fill #0

## 2015-09-19 DIAGNOSIS — L500 Allergic urticaria: Secondary | ICD-10-CM | POA: Diagnosis not present

## 2015-09-22 DIAGNOSIS — L509 Urticaria, unspecified: Secondary | ICD-10-CM | POA: Diagnosis not present

## 2015-09-22 MED FILL — predniSONE 20 MG TABS: 20 | 9 days supply | Qty: 18 | Fill #0

## 2015-10-13 MED FILL — NUVARING VAGINAL RING: 0.12-0.015 | 84 days supply | Qty: 3 | Fill #0

## 2016-01-03 MED FILL — BUTALB-ACETAMIN-CAFF 50-325: 50-325-40 | 5 days supply | Qty: 15 | Fill #0

## 2016-01-03 MED FILL — RIZATRIPTAN 10 MG ODT: 10 | 30 days supply | Qty: 9 | Fill #1

## 2016-01-03 MED FILL — NUVARING VAGINAL RING: 0.12-0.015 | 84 days supply | Qty: 3 | Fill #1

## 2016-01-10 DIAGNOSIS — G43839 Menstrual migraine, intractable, without status migrainosus: Secondary | ICD-10-CM | POA: Diagnosis not present

## 2016-01-10 DIAGNOSIS — Z049 Encounter for examination and observation for unspecified reason: Secondary | ICD-10-CM | POA: Diagnosis not present

## 2016-01-10 DIAGNOSIS — G43719 Chronic migraine without aura, intractable, without status migrainosus: Secondary | ICD-10-CM | POA: Diagnosis not present

## 2016-01-10 MED FILL — ZOMIG 5 MG NASAL SPRAY: 5 | 30 days supply | Qty: 6 | Fill #0

## 2016-01-10 MED FILL — ZONISAMIDE 25 MG CAPSULE: 25 | 30 days supply | Qty: 120 | Fill #0

## 2016-01-17 DIAGNOSIS — M542 Cervicalgia: Secondary | ICD-10-CM | POA: Diagnosis not present

## 2016-01-17 DIAGNOSIS — G43839 Menstrual migraine, intractable, without status migrainosus: Secondary | ICD-10-CM | POA: Diagnosis not present

## 2016-01-17 DIAGNOSIS — G43719 Chronic migraine without aura, intractable, without status migrainosus: Secondary | ICD-10-CM | POA: Diagnosis not present

## 2016-01-17 DIAGNOSIS — M791 Myalgia: Secondary | ICD-10-CM | POA: Diagnosis not present

## 2016-01-17 DIAGNOSIS — R51 Headache: Secondary | ICD-10-CM | POA: Diagnosis not present

## 2016-02-09 DIAGNOSIS — R51 Headache: Secondary | ICD-10-CM | POA: Diagnosis not present

## 2016-02-09 DIAGNOSIS — G43839 Menstrual migraine, intractable, without status migrainosus: Secondary | ICD-10-CM | POA: Diagnosis not present

## 2016-02-09 DIAGNOSIS — G43719 Chronic migraine without aura, intractable, without status migrainosus: Secondary | ICD-10-CM | POA: Diagnosis not present

## 2016-02-09 DIAGNOSIS — M791 Myalgia: Secondary | ICD-10-CM | POA: Diagnosis not present

## 2016-02-09 DIAGNOSIS — M542 Cervicalgia: Secondary | ICD-10-CM | POA: Diagnosis not present

## 2016-02-09 MED FILL — ZONISAMIDE 100 MG CAPSULE: 100 | 30 days supply | Qty: 30 | Fill #0

## 2016-02-15 DIAGNOSIS — H6691 Otitis media, unspecified, right ear: Secondary | ICD-10-CM | POA: Diagnosis not present

## 2016-02-15 MED FILL — AZITHROMYCIN 250 MG TABLET: 250 | 5 days supply | Qty: 6 | Fill #0

## 2016-02-22 DIAGNOSIS — G43719 Chronic migraine without aura, intractable, without status migrainosus: Secondary | ICD-10-CM | POA: Diagnosis not present

## 2016-02-22 DIAGNOSIS — M791 Myalgia: Secondary | ICD-10-CM | POA: Diagnosis not present

## 2016-02-22 DIAGNOSIS — G43839 Menstrual migraine, intractable, without status migrainosus: Secondary | ICD-10-CM | POA: Diagnosis not present

## 2016-02-22 DIAGNOSIS — R51 Headache: Secondary | ICD-10-CM | POA: Diagnosis not present

## 2016-02-22 DIAGNOSIS — M542 Cervicalgia: Secondary | ICD-10-CM | POA: Diagnosis not present

## 2016-03-15 MED FILL — ZONISAMIDE 100 MG CAPSULE: 100 | 30 days supply | Qty: 30 | Fill #0

## 2016-03-22 DIAGNOSIS — M791 Myalgia: Secondary | ICD-10-CM | POA: Diagnosis not present

## 2016-03-22 DIAGNOSIS — M542 Cervicalgia: Secondary | ICD-10-CM | POA: Diagnosis not present

## 2016-03-22 DIAGNOSIS — R51 Headache: Secondary | ICD-10-CM | POA: Diagnosis not present

## 2016-03-22 DIAGNOSIS — G43719 Chronic migraine without aura, intractable, without status migrainosus: Secondary | ICD-10-CM | POA: Diagnosis not present

## 2016-03-22 DIAGNOSIS — G43839 Menstrual migraine, intractable, without status migrainosus: Secondary | ICD-10-CM | POA: Diagnosis not present

## 2016-04-05 ENCOUNTER — Other Ambulatory Visit: Payer: Self-pay | Admitting: Family Medicine

## 2016-04-05 ENCOUNTER — Other Ambulatory Visit (HOSPITAL_COMMUNITY)
Admission: RE | Admit: 2016-04-05 | Discharge: 2016-04-05 | Disposition: A | Discharge status: Home / Self Care | Payer: 59 | Source: Ambulatory Visit | Attending: Family Medicine | Admitting: Family Medicine

## 2016-04-05 DIAGNOSIS — G43839 Menstrual migraine, intractable, without status migrainosus: Secondary | ICD-10-CM | POA: Diagnosis not present

## 2016-04-05 DIAGNOSIS — M791 Myalgia: Secondary | ICD-10-CM | POA: Diagnosis not present

## 2016-04-05 DIAGNOSIS — A6000 Herpesviral infection of urogenital system, unspecified: Secondary | ICD-10-CM | POA: Diagnosis not present

## 2016-04-05 DIAGNOSIS — G43719 Chronic migraine without aura, intractable, without status migrainosus: Secondary | ICD-10-CM | POA: Diagnosis not present

## 2016-04-05 DIAGNOSIS — Z124 Encounter for screening for malignant neoplasm of cervix: Secondary | ICD-10-CM | POA: Diagnosis not present

## 2016-04-05 DIAGNOSIS — G43009 Migraine without aura, not intractable, without status migrainosus: Secondary | ICD-10-CM | POA: Diagnosis not present

## 2016-04-05 DIAGNOSIS — R51 Headache: Secondary | ICD-10-CM | POA: Diagnosis not present

## 2016-04-05 DIAGNOSIS — Z0000 Encounter for general adult medical examination without abnormal findings: Secondary | ICD-10-CM | POA: Diagnosis not present

## 2016-04-05 DIAGNOSIS — M542 Cervicalgia: Secondary | ICD-10-CM | POA: Diagnosis not present

## 2016-04-10 LAB — CYTOLOGY - PAP

## 2016-04-13 MED FILL — NUVARING VAGINAL RING: 0.12-0.015 | 84 days supply | Qty: 3 | Fill #2

## 2016-04-13 MED FILL — RIZATRIPTAN 10 MG ODT: 10 | 30 days supply | Qty: 9 | Fill #2

## 2016-04-13 MED FILL — BUTALB-ACETAMIN-CAFF 50-325: 50-325-40 | 5 days supply | Qty: 15 | Fill #0

## 2016-04-13 MED FILL — ZONISAMIDE 100 MG CAPSULE: 100 | 30 days supply | Qty: 30 | Fill #0

## 2016-04-25 DIAGNOSIS — G43009 Migraine without aura, not intractable, without status migrainosus: Secondary | ICD-10-CM | POA: Diagnosis not present

## 2016-04-25 DIAGNOSIS — M791 Myalgia: Secondary | ICD-10-CM | POA: Diagnosis not present

## 2016-04-25 DIAGNOSIS — G43719 Chronic migraine without aura, intractable, without status migrainosus: Secondary | ICD-10-CM | POA: Diagnosis not present

## 2016-04-25 DIAGNOSIS — A6000 Herpesviral infection of urogenital system, unspecified: Secondary | ICD-10-CM | POA: Diagnosis not present

## 2016-04-25 DIAGNOSIS — G43839 Menstrual migraine, intractable, without status migrainosus: Secondary | ICD-10-CM | POA: Diagnosis not present

## 2016-04-25 DIAGNOSIS — Z124 Encounter for screening for malignant neoplasm of cervix: Secondary | ICD-10-CM | POA: Diagnosis not present

## 2016-04-25 DIAGNOSIS — Z0000 Encounter for general adult medical examination without abnormal findings: Secondary | ICD-10-CM | POA: Diagnosis not present

## 2016-04-25 DIAGNOSIS — R51 Headache: Secondary | ICD-10-CM | POA: Diagnosis not present

## 2016-04-25 DIAGNOSIS — M542 Cervicalgia: Secondary | ICD-10-CM | POA: Diagnosis not present

## 2016-04-27 MED FILL — ZONISAMIDE 50 MG CAPSULE: 50 | 30 days supply | Qty: 30 | Fill #0

## 2016-05-18 MED FILL — ZONISAMIDE 50 MG CAPSULE: 50 | 30 days supply | Qty: 30 | Fill #1

## 2016-05-21 MED FILL — ZONISAMIDE 100 MG CAPSULE: 100 | 30 days supply | Qty: 30 | Fill #0

## 2016-06-22 MED FILL — ZONISAMIDE 100 MG CAPSULE: 100 | 30 days supply | Qty: 30 | Fill #0

## 2016-06-22 MED FILL — ZONISAMIDE 50 MG CAPSULE: 50 | 30 days supply | Qty: 30 | Fill #0

## 2016-06-22 MED FILL — NUVARING VAGINAL RING: 0.12-0.015 | 84 days supply | Qty: 3 | Fill #3

## 2016-07-04 DIAGNOSIS — G43839 Menstrual migraine, intractable, without status migrainosus: Secondary | ICD-10-CM | POA: Diagnosis not present

## 2016-07-04 DIAGNOSIS — G43719 Chronic migraine without aura, intractable, without status migrainosus: Secondary | ICD-10-CM | POA: Diagnosis not present

## 2016-09-07 MED FILL — NUVARING VAGINAL RING: 0.12-0.015 | 84 days supply | Qty: 3 | Fill #0

## 2016-09-14 MED FILL — RIZATRIPTAN 10 MG ODT: 10 | 29 days supply | Qty: 10 | Fill #0

## 2016-10-02 MED FILL — predniSONE 10 MG TABS: 10 | 6 days supply | Qty: 21 | Fill #0

## 2017-02-07 DIAGNOSIS — R0981 Nasal congestion: Secondary | ICD-10-CM | POA: Diagnosis not present

## 2017-02-07 DIAGNOSIS — G43009 Migraine without aura, not intractable, without status migrainosus: Secondary | ICD-10-CM | POA: Diagnosis not present

## 2017-02-07 MED FILL — FLUTICASONE PROP 50 MCG SPR: 50 | 60 days supply | Qty: 16 | Fill #0

## 2017-02-07 MED FILL — BUTALBITAL/APAP/CAFFEINE TB: 50-325-40 | 30 days supply | Qty: 10 | Fill #0

## 2017-02-07 MED FILL — RIZATRIPTAN 10 MG ODT: 10 | 30 days supply | Qty: 18 | Fill #0

## 2017-04-23 MED FILL — BUTALB-ACETAMIN-CAFF 50-325: 50-325-40 | 30 days supply | Qty: 10 | Fill #0

## 2017-04-23 MED FILL — RIZATRIPTAN 10 MG ODT: 10 | 30 days supply | Qty: 10 | Fill #0

## 2017-06-14 DIAGNOSIS — Z3201 Encounter for pregnancy test, result positive: Secondary | ICD-10-CM | POA: Diagnosis not present

## 2017-08-09 DIAGNOSIS — Z118 Encounter for screening for other infectious and parasitic diseases: Secondary | ICD-10-CM | POA: Diagnosis not present

## 2017-08-09 DIAGNOSIS — Z3481 Encounter for supervision of other normal pregnancy, first trimester: Secondary | ICD-10-CM | POA: Diagnosis not present

## 2017-08-09 DIAGNOSIS — Z3689 Encounter for other specified antenatal screening: Secondary | ICD-10-CM | POA: Diagnosis not present

## 2017-08-09 LAB — OB RESULTS CONSOLE ABO/RH: RH TYPE: POSITIVE

## 2017-08-09 LAB — OB RESULTS CONSOLE GC/CHLAMYDIA
Chlamydia: NEGATIVE
GC PROBE AMP, GENITAL: NEGATIVE

## 2017-08-09 LAB — OB RESULTS CONSOLE HEPATITIS B SURFACE ANTIGEN: Hepatitis B Surface Ag: NEGATIVE

## 2017-08-09 LAB — OB RESULTS CONSOLE RUBELLA ANTIBODY, IGM: Rubella: IMMUNE

## 2017-08-09 LAB — OB RESULTS CONSOLE ANTIBODY SCREEN: Antibody Screen: NEGATIVE

## 2017-08-09 LAB — OB RESULTS CONSOLE RPR: RPR: NONREACTIVE

## 2017-08-09 LAB — OB RESULTS CONSOLE HIV ANTIBODY (ROUTINE TESTING): HIV: NONREACTIVE

## 2017-09-06 DIAGNOSIS — Z361 Encounter for antenatal screening for raised alphafetoprotein level: Secondary | ICD-10-CM | POA: Diagnosis not present

## 2017-09-23 DIAGNOSIS — Z363 Encounter for antenatal screening for malformations: Secondary | ICD-10-CM | POA: Diagnosis not present

## 2017-10-24 DIAGNOSIS — Z3689 Encounter for other specified antenatal screening: Secondary | ICD-10-CM | POA: Diagnosis not present

## 2017-10-24 DIAGNOSIS — Z3482 Encounter for supervision of other normal pregnancy, second trimester: Secondary | ICD-10-CM | POA: Diagnosis not present

## 2017-12-02 DIAGNOSIS — Z23 Encounter for immunization: Secondary | ICD-10-CM | POA: Diagnosis not present

## 2017-12-02 DIAGNOSIS — Z3689 Encounter for other specified antenatal screening: Secondary | ICD-10-CM | POA: Diagnosis not present

## 2018-01-13 MED FILL — VALACYCLOVIR HCL 500 MG TAB: 500 | 30 days supply | Qty: 30 | Fill #0

## 2018-01-28 DIAGNOSIS — O321XX0 Maternal care for breech presentation, not applicable or unspecified: Secondary | ICD-10-CM | POA: Diagnosis not present

## 2018-01-28 DIAGNOSIS — Z3A36 36 weeks gestation of pregnancy: Secondary | ICD-10-CM | POA: Diagnosis not present

## 2018-01-28 DIAGNOSIS — Z3685 Encounter for antenatal screening for Streptococcus B: Secondary | ICD-10-CM | POA: Diagnosis not present

## 2018-01-31 ENCOUNTER — Other Ambulatory Visit: Payer: Self-pay | Admitting: Obstetrics & Gynecology

## 2018-02-03 ENCOUNTER — Encounter (HOSPITAL_COMMUNITY): Payer: Self-pay | Admitting: *Deleted

## 2018-02-04 ENCOUNTER — Encounter (HOSPITAL_COMMUNITY): Payer: Self-pay

## 2018-02-13 ENCOUNTER — Encounter (HOSPITAL_COMMUNITY)
Admission: RE | Admit: 2018-02-13 | Discharge: 2018-02-13 | Disposition: A | Discharge status: Home / Self Care | Payer: 59 | Source: Ambulatory Visit | Attending: Obstetrics & Gynecology | Admitting: Obstetrics & Gynecology

## 2018-02-13 DIAGNOSIS — Z3A39 39 weeks gestation of pregnancy: Secondary | ICD-10-CM | POA: Diagnosis not present

## 2018-02-13 DIAGNOSIS — O34211 Maternal care for low transverse scar from previous cesarean delivery: Secondary | ICD-10-CM | POA: Diagnosis not present

## 2018-02-13 DIAGNOSIS — Z87891 Personal history of nicotine dependence: Secondary | ICD-10-CM | POA: Diagnosis not present

## 2018-02-13 LAB — CBC
HCT: 38.3 % (ref 36.0–46.0)
HEMOGLOBIN: 13.1 g/dL (ref 12.0–15.0)
MCH: 30.8 pg (ref 26.0–34.0)
MCHC: 34.2 g/dL (ref 30.0–36.0)
MCV: 90.1 fL (ref 78.0–100.0)
PLATELETS: 182 10*3/uL (ref 150–400)
RBC: 4.25 MIL/uL (ref 3.87–5.11)
RDW: 13.4 % (ref 11.5–15.5)
WBC: 8.3 10*3/uL (ref 4.0–10.5)

## 2018-02-13 LAB — TYPE AND SCREEN
ABO/RH(D): O POS
Antibody Screen: NEGATIVE

## 2018-02-13 NOTE — Patient Instructions (Signed)
Regina French  02/13/2018   Your procedure is scheduled on:  02/14/2018  Enter through the Main Entrance of [facility] [place] up the phone at the desk and dial [phone]  Call this number if you have problems the morning of surgery:[phone]  Remember:   Do not eat food:(After Midnight) Desps de medianoche.  Do not drink clear liquids: (After Midnight) Desps de medianoche.  Take these medicines the morning of surgery with A SIP OF WATER: valtrex   Do not wear jewelry, make-up or nail polish.  Do not wear lotions, powders, or perfumes. Do not wear deodorant.  Do not shave 48 hours prior to surgery.  Do not bring valuables to the hospital.  [facility] is not   responsible for any belongings or valuables brought to the hospital.  Contacts, dentures or bridgework may not be worn into surgery.  Leave suitcase in the car. After surgery it may be brought to your room.  For patients admitted to the hospital, checkout time is 11:00 AM the day of              discharge.    N/A   Please read over the following fact sheets that you were given:   Surgical Site Infection Prevention

## 2018-02-14 ENCOUNTER — Inpatient Hospital Stay (HOSPITAL_COMMUNITY)
Admission: AD | Admit: 2018-02-14 | Discharge: 2018-02-16 | DRG: 788 | Disposition: A | Discharge status: Home / Self Care | Payer: 59 | Source: Ambulatory Visit | Attending: Obstetrics & Gynecology | Admitting: Obstetrics & Gynecology

## 2018-02-14 ENCOUNTER — Inpatient Hospital Stay (HOSPITAL_COMMUNITY): Payer: 59 | Admitting: Anesthesiology

## 2018-02-14 ENCOUNTER — Encounter (HOSPITAL_COMMUNITY)
Admission: AD | Disposition: A | Discharge status: Home / Self Care | Payer: Self-pay | Source: Ambulatory Visit | Attending: Obstetrics & Gynecology

## 2018-02-14 ENCOUNTER — Encounter (HOSPITAL_COMMUNITY): Payer: Self-pay | Admitting: *Deleted

## 2018-02-14 DIAGNOSIS — Z98891 History of uterine scar from previous surgery: Secondary | ICD-10-CM

## 2018-02-14 DIAGNOSIS — O34211 Maternal care for low transverse scar from previous cesarean delivery: Secondary | ICD-10-CM | POA: Diagnosis not present

## 2018-02-14 DIAGNOSIS — Z3A39 39 weeks gestation of pregnancy: Secondary | ICD-10-CM | POA: Diagnosis not present

## 2018-02-14 DIAGNOSIS — O34219 Maternal care for unspecified type scar from previous cesarean delivery: Secondary | ICD-10-CM | POA: Diagnosis not present

## 2018-02-14 DIAGNOSIS — Z87891 Personal history of nicotine dependence: Secondary | ICD-10-CM

## 2018-02-14 LAB — RPR: RPR Ser Ql: NONREACTIVE

## 2018-02-14 SURGERY — Surgical Case
Anesthesia: Spinal

## 2018-02-14 MED ORDER — PROMETHAZINE HCL 25 MG/ML IJ SOLN: 6.2500 mg | INTRAMUSCULAR | Status: DC | PRN

## 2018-02-14 MED ORDER — SIMETHICONE 80 MG PO CHEW
80.0000 mg | CHEWABLE_TABLET | ORAL | Status: DC
  Administered 2018-02-15 – 2018-02-16 (×2): 80 mg via ORAL
  Filled 2018-02-14 (×2): qty 1

## 2018-02-14 MED ORDER — PHENYLEPHRINE 40 MCG/ML (10ML) SYRINGE FOR IV PUSH (FOR BLOOD PRESSURE SUPPORT)
PREFILLED_SYRINGE | INTRAVENOUS | Status: AC
  Filled 2018-02-14: qty 10

## 2018-02-14 MED ORDER — FAMOTIDINE 20 MG PO TABS
20.0000 mg | ORAL_TABLET | Freq: Once | ORAL | Status: AC
  Administered 2018-02-14: 20 mg via ORAL
  Filled 2018-02-14: qty 1

## 2018-02-14 MED ORDER — DIBUCAINE 1 % RE OINT: 1.0000 "application " | TOPICAL_OINTMENT | RECTAL | Status: DC | PRN

## 2018-02-14 MED ORDER — OXYCODONE-ACETAMINOPHEN 5-325 MG PO TABS
1.0000 | ORAL_TABLET | ORAL | Status: DC | PRN
  Filled 2018-02-14: qty 1

## 2018-02-14 MED ORDER — ACETAMINOPHEN 325 MG PO TABS
650.0000 mg | ORAL_TABLET | ORAL | Status: DC | PRN
  Administered 2018-02-16: 650 mg via ORAL
  Filled 2018-02-14 (×2): qty 2

## 2018-02-14 MED ORDER — DIPHENHYDRAMINE HCL 25 MG PO CAPS
25.0000 mg | ORAL_CAPSULE | ORAL | Status: DC | PRN
  Filled 2018-02-14: qty 1

## 2018-02-14 MED ORDER — FENTANYL CITRATE (PF) 100 MCG/2ML IJ SOLN
INTRAMUSCULAR | Status: AC
  Filled 2018-02-14: qty 2

## 2018-02-14 MED ORDER — DIPHENHYDRAMINE HCL 50 MG/ML IJ SOLN: 12.5000 mg | INTRAMUSCULAR | Status: DC | PRN

## 2018-02-14 MED ORDER — BUPIVACAINE IN DEXTROSE 0.75-8.25 % IT SOLN
INTRATHECAL | Status: DC | PRN
  Administered 2018-02-14: 1.6 mL via INTRATHECAL

## 2018-02-14 MED ORDER — ONDANSETRON HCL 4 MG/2ML IJ SOLN
INTRAMUSCULAR | Status: DC | PRN
  Administered 2018-02-14: 4 mg via INTRAVENOUS

## 2018-02-14 MED ORDER — PRENATAL MULTIVITAMIN CH
1.0000 | ORAL_TABLET | Freq: Every day | ORAL | Status: DC
  Administered 2018-02-15: 1 via ORAL
  Filled 2018-02-14: qty 1

## 2018-02-14 MED ORDER — PHENYLEPHRINE HCL 10 MG/ML IJ SOLN
INTRAMUSCULAR | Status: DC | PRN
  Administered 2018-02-14 (×2): 100 ug via INTRAVENOUS

## 2018-02-14 MED ORDER — FENTANYL CITRATE (PF) 100 MCG/2ML IJ SOLN
INTRAMUSCULAR | Status: DC | PRN
  Administered 2018-02-14: 10 ug via INTRATHECAL

## 2018-02-14 MED ORDER — OXYCODONE-ACETAMINOPHEN 5-325 MG PO TABS: 2.0000 | ORAL_TABLET | ORAL | Status: DC | PRN

## 2018-02-14 MED ORDER — ACETAMINOPHEN 500 MG PO TABS
1000.0000 mg | ORAL_TABLET | Freq: Four times a day (QID) | ORAL | Status: AC
  Administered 2018-02-14 – 2018-02-15 (×4): 1000 mg via ORAL
  Filled 2018-02-14 (×4): qty 2

## 2018-02-14 MED ORDER — MENTHOL 3 MG MT LOZG: 1.0000 | LOZENGE | OROMUCOSAL | Status: DC | PRN

## 2018-02-14 MED ORDER — NALOXONE HCL 0.4 MG/ML IJ SOLN: 0.4000 mg | INTRAMUSCULAR | Status: DC | PRN

## 2018-02-14 MED ORDER — SIMETHICONE 80 MG PO CHEW: 80.0000 mg | CHEWABLE_TABLET | ORAL | Status: DC | PRN

## 2018-02-14 MED ORDER — NALBUPHINE HCL 10 MG/ML IJ SOLN: 5.0000 mg | INTRAMUSCULAR | Status: DC | PRN

## 2018-02-14 MED ORDER — LACTATED RINGERS IV SOLN
INTRAVENOUS | Status: DC | PRN
  Administered 2018-02-14: 40 [IU] via INTRAVENOUS

## 2018-02-14 MED ORDER — LACTATED RINGERS IV SOLN
INTRAVENOUS | Status: DC | PRN
  Administered 2018-02-14: 14:00:00 via INTRAVENOUS

## 2018-02-14 MED ORDER — FENTANYL CITRATE (PF) 100 MCG/2ML IJ SOLN: 25.0000 ug | INTRAMUSCULAR | Status: DC | PRN

## 2018-02-14 MED ORDER — MEPERIDINE HCL 25 MG/ML IJ SOLN: 6.2500 mg | INTRAMUSCULAR | Status: DC | PRN

## 2018-02-14 MED ORDER — KETOROLAC TROMETHAMINE 30 MG/ML IJ SOLN
INTRAMUSCULAR | Status: AC
  Filled 2018-02-14: qty 1

## 2018-02-14 MED ORDER — SCOPOLAMINE 1 MG/3DAYS TD PT72: 1.0000 | MEDICATED_PATCH | Freq: Once | TRANSDERMAL | Status: DC

## 2018-02-14 MED ORDER — KETOROLAC TROMETHAMINE 30 MG/ML IJ SOLN: 30.0000 mg | Freq: Four times a day (QID) | INTRAMUSCULAR | Status: AC | PRN

## 2018-02-14 MED ORDER — SODIUM CHLORIDE 0.9% FLUSH: 3.0000 mL | INTRAVENOUS | Status: DC | PRN

## 2018-02-14 MED ORDER — KETOROLAC TROMETHAMINE 30 MG/ML IJ SOLN
30.0000 mg | Freq: Four times a day (QID) | INTRAMUSCULAR | Status: AC | PRN
  Administered 2018-02-14: 30 mg via INTRAVENOUS

## 2018-02-14 MED ORDER — GENTAMICIN SULFATE 40 MG/ML IJ SOLN
INTRAMUSCULAR | Status: AC
Start: 2018-02-14 — End: 2018-02-14
  Administered 2018-02-14: 100 mg via INTRAVENOUS
  Filled 2018-02-14: qty 8

## 2018-02-14 MED ORDER — MORPHINE SULFATE (PF) 0.5 MG/ML IJ SOLN
INTRAMUSCULAR | Status: AC
  Filled 2018-02-14: qty 10

## 2018-02-14 MED ORDER — TETANUS-DIPHTH-ACELL PERTUSSIS 5-2.5-18.5 LF-MCG/0.5 IM SUSP: 0.5000 mL | Freq: Once | INTRAMUSCULAR | Status: DC

## 2018-02-14 MED ORDER — MORPHINE SULFATE (PF) 0.5 MG/ML IJ SOLN
INTRAMUSCULAR | Status: DC | PRN
  Administered 2018-02-14: .2 mg via INTRATHECAL

## 2018-02-14 MED ORDER — LACTATED RINGERS IV SOLN
INTRAVENOUS | Status: DC
  Administered 2018-02-14 – 2018-02-15 (×2): via INTRAVENOUS

## 2018-02-14 MED ORDER — LACTATED RINGERS IV SOLN
INTRAVENOUS | Status: DC
  Administered 2018-02-14 (×2): via INTRAVENOUS

## 2018-02-14 MED ORDER — NALBUPHINE HCL 10 MG/ML IJ SOLN: 5.0000 mg | Freq: Once | INTRAMUSCULAR | Status: DC | PRN

## 2018-02-14 MED ORDER — EPHEDRINE 5 MG/ML INJ
INTRAVENOUS | Status: AC
Start: 2018-02-14 — End: ?
  Filled 2018-02-14: qty 10

## 2018-02-14 MED ORDER — SOD CITRATE-CITRIC ACID 500-334 MG/5ML PO SOLN
30.0000 mL | Freq: Once | ORAL | Status: AC
  Administered 2018-02-14: 30 mL via ORAL
  Filled 2018-02-14: qty 15

## 2018-02-14 MED ORDER — COCONUT OIL OIL: 1.0000 "application " | TOPICAL_OIL | Status: DC | PRN

## 2018-02-14 MED ORDER — WITCH HAZEL-GLYCERIN EX PADS: 1.0000 "application " | MEDICATED_PAD | CUTANEOUS | Status: DC | PRN

## 2018-02-14 MED ORDER — OXYTOCIN 10 UNIT/ML IJ SOLN
INTRAMUSCULAR | Status: AC
  Filled 2018-02-14: qty 4

## 2018-02-14 MED ORDER — PHENYLEPHRINE 8 MG IN D5W 100 ML (0.08MG/ML) PREMIX OPTIME
INJECTION | INTRAVENOUS | Status: AC
  Filled 2018-02-14: qty 100

## 2018-02-14 MED ORDER — ONDANSETRON HCL 4 MG/2ML IJ SOLN
INTRAMUSCULAR | Status: AC
  Filled 2018-02-14: qty 2

## 2018-02-14 MED ORDER — SIMETHICONE 80 MG PO CHEW
80.0000 mg | CHEWABLE_TABLET | Freq: Three times a day (TID) | ORAL | Status: DC
  Administered 2018-02-14 – 2018-02-16 (×5): 80 mg via ORAL
  Filled 2018-02-14 (×5): qty 1

## 2018-02-14 MED ORDER — ONDANSETRON HCL 4 MG/2ML IJ SOLN: 4.0000 mg | Freq: Three times a day (TID) | INTRAMUSCULAR | Status: DC | PRN

## 2018-02-14 MED ORDER — SCOPOLAMINE 1 MG/3DAYS TD PT72
1.0000 | MEDICATED_PATCH | Freq: Once | TRANSDERMAL | Status: DC
  Administered 2018-02-14: 1.5 mg via TRANSDERMAL
  Filled 2018-02-14: qty 1

## 2018-02-14 MED ORDER — PHENYLEPHRINE 8 MG IN D5W 100 ML (0.08MG/ML) PREMIX OPTIME
INJECTION | INTRAVENOUS | Status: DC | PRN
  Administered 2018-02-14: 60 ug/min via INTRAVENOUS

## 2018-02-14 MED ORDER — DIPHENHYDRAMINE HCL 25 MG PO CAPS
25.0000 mg | ORAL_CAPSULE | Freq: Four times a day (QID) | ORAL | Status: DC | PRN
  Administered 2018-02-14 – 2018-02-15 (×4): 25 mg via ORAL
  Filled 2018-02-14 (×4): qty 1

## 2018-02-14 MED ORDER — SENNOSIDES-DOCUSATE SODIUM 8.6-50 MG PO TABS
2.0000 | ORAL_TABLET | ORAL | Status: DC
  Administered 2018-02-15 – 2018-02-16 (×2): 2 via ORAL
  Filled 2018-02-14 (×2): qty 2

## 2018-02-14 MED ORDER — ZOLPIDEM TARTRATE 5 MG PO TABS: 5.0000 mg | ORAL_TABLET | Freq: Every evening | ORAL | Status: DC | PRN

## 2018-02-14 MED ORDER — NALOXONE HCL 4 MG/10ML IJ SOLN
1.0000 ug/kg/h | INTRAVENOUS | Status: DC | PRN
  Filled 2018-02-14: qty 5

## 2018-02-14 MED ORDER — OXYTOCIN 40 UNITS IN LACTATED RINGERS INFUSION - SIMPLE MED: 2.5000 [IU]/h | INTRAVENOUS | Status: AC

## 2018-02-14 MED ORDER — IBUPROFEN 600 MG PO TABS
600.0000 mg | ORAL_TABLET | Freq: Four times a day (QID) | ORAL | Status: DC
  Administered 2018-02-15 – 2018-02-16 (×6): 600 mg via ORAL
  Filled 2018-02-14 (×6): qty 1

## 2018-02-14 SURGICAL SUPPLY — 35 items
BENZOIN TINCTURE PRP APPL 2/3 (GAUZE/BANDAGES/DRESSINGS) ×3 IMPLANT
CHLORAPREP W/TINT 26ML (MISCELLANEOUS) ×3 IMPLANT
CLAMP CORD UMBIL (MISCELLANEOUS) IMPLANT
CLOSURE STERI STRIP 1/2 X4 (GAUZE/BANDAGES/DRESSINGS) ×3 IMPLANT
CLOSURE WOUND 1/2 X4 (GAUZE/BANDAGES/DRESSINGS)
CLOTH BEACON ORANGE TIMEOUT ST (SAFETY) ×3 IMPLANT
DRSG OPSITE POSTOP 4X10 (GAUZE/BANDAGES/DRESSINGS) ×3 IMPLANT
ELECT REM PT RETURN 9FT ADLT (ELECTROSURGICAL) ×3
ELECTRODE REM PT RTRN 9FT ADLT (ELECTROSURGICAL) ×1 IMPLANT
EXTRACTOR VACUUM KIWI (MISCELLANEOUS) IMPLANT
EXTRACTOR VACUUM M CUP 4 TUBE (SUCTIONS) IMPLANT
EXTRACTOR VACUUM M CUP 4' TUBE (SUCTIONS)
GLOVE BIO SURGEON STRL SZ7 (GLOVE) ×3 IMPLANT
GLOVE BIOGEL PI IND STRL 7.0 (GLOVE) ×2 IMPLANT
GLOVE BIOGEL PI INDICATOR 7.0 (GLOVE) ×4
GOWN STRL REUS W/TWL LRG LVL3 (GOWN DISPOSABLE) ×6 IMPLANT
KIT ABG SYR 3ML LUER SLIP (SYRINGE) IMPLANT
NEEDLE HYPO 25X5/8 SAFETYGLIDE (NEEDLE) IMPLANT
NS IRRIG 1000ML POUR BTL (IV SOLUTION) ×3 IMPLANT
PACK C SECTION WH (CUSTOM PROCEDURE TRAY) ×3 IMPLANT
PAD OB MATERNITY 4.3X12.25 (PERSONAL CARE ITEMS) ×3 IMPLANT
RTRCTR C-SECT PINK 25CM LRG (MISCELLANEOUS) IMPLANT
STRIP CLOSURE SKIN 1/2X4 (GAUZE/BANDAGES/DRESSINGS) IMPLANT
SUT MON AB-0 CT1 36 (SUTURE) ×6 IMPLANT
SUT PLAIN 0 NONE (SUTURE) IMPLANT
SUT PLAIN 2 0 (SUTURE)
SUT PLAIN ABS 2-0 CT1 27XMFL (SUTURE) IMPLANT
SUT VIC AB 0 CT1 27 (SUTURE) ×4
SUT VIC AB 0 CT1 27XBRD ANBCTR (SUTURE) ×2 IMPLANT
SUT VIC AB 2-0 CT1 27 (SUTURE) ×2
SUT VIC AB 2-0 CT1 TAPERPNT 27 (SUTURE) ×1 IMPLANT
SUT VIC AB 4-0 KS 27 (SUTURE) ×3 IMPLANT
SUT VICRYL 0 TIES 12 18 (SUTURE) IMPLANT
TOWEL OR 17X24 6PK STRL BLUE (TOWEL DISPOSABLE) ×3 IMPLANT
TRAY FOLEY W/BAG SLVR 14FR LF (SET/KITS/TRAYS/PACK) IMPLANT

## 2018-02-14 NOTE — Anesthesia Preprocedure Evaluation (Signed)
Anesthesia Evaluation  Patient identified by MRN, date of birth, ID band Patient awake    Reviewed: Allergy & Precautions, NPO status , Patient's Chart, lab work & pertinent test results  Airway Mallampati: I  TM Distance: >3 FB Neck ROM: Full    Dental  (+) Teeth Intact, Dental Advisory Given   Pulmonary former smoker,    Pulmonary exam normal breath sounds clear to auscultation       Cardiovascular negative cardio ROS Normal cardiovascular exam Rhythm:Regular Rate:Normal     Neuro/Psych  Headaches, negative psych ROS   GI/Hepatic negative GI ROS, Neg liver ROS,   Endo/Other  negative endocrine ROS  Renal/GU negative Renal ROS     Musculoskeletal negative musculoskeletal ROS (+)   Abdominal   Peds  Hematology negative hematology ROS (+) Plt 182k   Anesthesia Other Findings Day of surgery medications reviewed with the patient.  Reproductive/Obstetrics (+) Pregnancy H/o C-section x1                              Anesthesia Physical Anesthesia Plan  ASA: II  Anesthesia Plan: Spinal   Post-op Pain Management:    Induction:   PONV Risk Score and Plan: 2 and Dexamethasone, Ondansetron and Scopolamine patch - Pre-op  Airway Management Planned: Natural Airway  Additional Equipment:   Intra-op Plan:   Post-operative Plan:   Informed Consent: I have reviewed the patients History and Physical, chart, labs and discussed the procedure including the risks, benefits and alternatives for the proposed anesthesia with the patient or authorized representative who has indicated his/her understanding and acceptance.   Dental advisory given  Plan Discussed with: CRNA, Anesthesiologist and Surgeon  Anesthesia Plan Comments: (Discussed risks and benefits of and differences between spinal and general. Discussed risks of spinal including headache, backache, failure, bleeding, infection, and nerve  damage. Patient consents to spinal. Questions answered. Coagulation studies and platelet count acceptable.)        Anesthesia Quick Evaluation

## 2018-02-14 NOTE — Op Note (Signed)
Cesarean Section Procedure Note   Sabriya Yono  02/14/2018  Indications: Scheduled Proceedure/Maternal Request   Procedure: Repeat Low transverse cesarean section   Pre-operative Diagnosis: Previous Cesarean Section. 39 weeks   Post-operative Diagnosis: Same   Surgeon: Azucena Fallen, MD   Assistants: none  Anesthesia: spinal   Procedure Details:  The patient was seen in the Holding Room. The risks, benefits, complications, treatment options, and expected outcomes were discussed with the patient. The patient concurred with the proposed plan, giving informed consent. identified as [name] and the procedure verified as C-Section Delivery. A Time Out was held and the above information confirmed. 2 gm Ancef given.  After induction of anesthesia, the patient was draped and prepped in the usual sterile manner. A Pfannenstiel Incision was made and carried down through the subcutaneous tissue to the fascia. Fascial incision was made and extended transversely. The fascia was separated from the underlying rectus tissue superiorly and inferiorly. The peritoneum was identified and entered. Peritoneal incision was extended longitudinally. Alexis retractor placed. The utero-vesical peritoneal reflection was incised transversely and the bladder flap was bluntly freed from the lower uterine segment. A low transverse uterine incision was made. Copious amniotic fluid drained and baby was kept in position with fundal pressure as the head was floating. Baby BOY was delivered cephalic, at 52.77 hours on 02/14/2018. Delayed cord clamping done at 1 minute and baby handed to NICU team.  Apgar scores of 8 at one minute and 9 at five minutes. Cord ph was not sent, cord blood was obtained for evaluation. The placenta was removed Intact and appeared normal. The uterine outline, tubes and ovaries appeared normal}. The uterine incision was closed with running locked sutures of 0 Monocryl followed by another imbricating  layer. Hemostasis was observed. Alexis retractor removed. Peritoneal closure with 2-0 Vicryl. The fascia was then reapproximated with running sutures of 0Vicryl.  The skin was closed with 4-0Vicryl.   Instrument, sponge, and needle counts were correct prior the abdominal closure and were correct at the conclusion of the case.   Findings: Female infant, delivered at 14.19 hours. Apgars 8 and 9. Delayed cord clamping. Kerr hysterotomy, 2 layer closure.    Estimated Blood Loss: 614 mL   Total IV Fluids: 2300 CC LR   Urine Output: 100CC OF clear urine  Specimens: cord blood   Complications: no complications  Disposition: PACU - hemodynamically stable.   Maternal Condition: stable   Baby condition / location:  Couplet care / Skin to Skin  Attending Attestation: I performed the procedure.   Signed: Surgeon(s): Azucena Fallen, MD

## 2018-02-14 NOTE — H&P (Signed)
Regina French is a 32 y.o. female presenting for scheduled repeat C/section.  Good FM. No ROM, no bleeding, no UCs.  Uncomplicated PNCare, from 8wks, EDC c/w 8 wk sono.   S>D at 36 wks, sono - EFW 7'8" 84%, AC 96%, AFI 99% at 24 cm, Vx,  OB History    Gravida  2   Para  1   Term  1   Preterm      AB      Living  1     SAB      TAB      Ectopic      Multiple  0   Live Births  1          Past Medical History:  Diagnosis Date  . Dependent edema 07/16/2014  . Headache   . Herpes   . Hx of varicella   . Postpartum care following cesarean delivery with excision of LT dermoid cyst (12/8) 07/13/2014  . Substance abuse [facility])    Past Surgical History:  Procedure Laterality Date  . CESAREAN SECTION N/A 07/13/2014   Procedure: CESAREAN SECTION;  Surgeon: Elveria Royals, MD;  Location: [facility];  Service: Obstetrics;  Laterality: N/A;  . NO PAST SURGERIES     Family History: family history includes Birth defects in her cousin; Cancer in her mother; Heart disease in her maternal grandfather. Social History:  reports that she quit smoking about 4 years ago. She smoked 7.00 packs per day. She has never used smokeless tobacco. She reports that she does not drink alcohol or use drugs. 2    Maternal Diabetes: No Genetic Screening: declined  Maternal Ultrasounds/Referrals: Normal Fetal Ultrasounds or other Referrals:  None Maternal Substance Abuse:  No Significant Maternal Medications:  Meds include: Other: Valtrex Significant Maternal Lab Results:  Lab values include: Group B Strep negative Other Comments:  None  ROS neg  History reviewed Med/Surg/FamHx/ Allergies   Exam Physical Exam  BP 118/68   Pulse 88   Temp 98.3 F (36.8 C) (Oral)   Resp 18   Ht 5\' 4"  (1.626 m)   Wt 165 lb (74.8 kg)   BMI 28.32 kg/m   A&O x 3, no acute distress. Pleasant HEENT neg, no thyromegaly Lungs CTA bilat CV RRR, A1S2 normal Abdo soft, non tender, non acute Extr no edema/  tenderness Pelvic cx closed, long FHT  140s  [place] Prenatal labs: ABO, Rh: --/--/O POS (07/11 0945) Antibody: NEG (07/11 0945) Rubella: Immune (01/04 0000) RPR: Non Reactive (07/11 0945)  HBsAg: Negative (01/04 0000)  HIV: Non-reactive (01/04 0000)  GBS:   neg   Assessment/Plan: 32 yo G2P1001. 39 wks, Repeat scheduled C/section Risks/complications of surgery reviewed incl infection, bleeding, damage to internal organs including bladder, bowels, ureters, blood vessels, other risks from anesthesia, VTE and delayed complications of any surgery, complications in future surgery reviewed. Also discussed neonatal complications incl difficult delivery, laceration, vacuum assistance, TTN etc. Pt understands and agrees, all concerns addressed.       Elveria Royals 02/14/2018, 8:12 AM

## 2018-02-14 NOTE — Transfer of Care (Signed)
Immediate Anesthesia Transfer of Care Note  Patient: Regina French  Procedure(s) Performed: Repeat CESAREAN SECTION (N/A )  Patient Location: PACU  Anesthesia Type:Spinal  Level of Consciousness: awake, alert  and oriented  Airway & Oxygen Therapy: Patient Spontanous Breathing  Post-op Assessment: Report given to RN and Post -op Vital signs reviewed and stable  Post vital signs: Reviewed and stable  Last Vitals:  Vitals Value Taken Time  BP    Temp    Pulse    Resp    SpO2      Last Pain:  Vitals:   02/14/18 1151  TempSrc: Oral         Complications: No apparent anesthesia complications

## 2018-02-14 NOTE — Consult Note (Signed)
Neonatology Note:   Attendance at C-section:    I was asked by Dr. Benjie Karvonen to attend this repeat C/S at term. The mother is a G2P1, GBS neg with good prenatal care. ROM 0 hours before delivery, fluid clear. Infant vigorous with good spontaneous cry and tone. +60 sec DCC.  Needed only minimal bulb suctioning. Ap 8/9. Lungs clear to ausc in DR. Midline sacral pit noted.  To CN to care of Pediatrician.  Monia Sabal Katherina Mires, MD

## 2018-02-14 NOTE — Anesthesia Procedure Notes (Signed)
Spinal  Patient location during procedure: OR Start time: 02/14/2018 1:52 PM End time: 02/14/2018 1:54 PM Staffing Anesthesiologist: [name], MD Performed: anesthesiologist  Preanesthetic Checklist Completed: patient identified, surgical consent, pre-op evaluation, timeout performed, IV checked, risks and benefits discussed and monitors and equipment checked Spinal Block Patient position: sitting Prep: site prepped and draped and DuraPrep Patient monitoring: continuous pulse ox and blood pressure Approach: midline Location: L3-4 Injection technique: single-shot Needle Needle type: Pencan  Needle gauge: 24 G Assessment Sensory level: T6 Additional Notes Functioning IV was confirmed and monitors were applied. Sterile prep and drape, including hand hygiene, mask and sterile gloves were used. The patient was positioned and the spine was prepped. The skin was anesthetized with lidocaine.  Free flow of clear CSF was obtained prior to injecting local anesthetic into the CSF.  The spinal needle aspirated freely following injection.  The needle was carefully withdrawn.  The patient tolerated the procedure well. Consent was obtained prior to procedure with all questions answered and concerns addressed. Risks including but not limited to bleeding, infection, nerve damage, paralysis, failed block, inadequate analgesia, allergic reaction, high spinal, itching and headache were discussed and the patient wished to proceed.   Hoy Morn, MD

## 2018-02-14 NOTE — Anesthesia Postprocedure Evaluation (Signed)
Anesthesia Post Note  Patient: [name]  Procedure(s) Performed: Repeat CESAREAN SECTION (N/A )     Patient location during evaluation: PACU Anesthesia Type: Spinal Level of consciousness: oriented and awake and alert Pain management: pain level controlled Vital Signs Assessment: post-procedure vital signs reviewed and stable Respiratory status: spontaneous breathing, respiratory function stable and patient connected to nasal cannula oxygen Cardiovascular status: blood pressure returned to baseline and stable Postop Assessment: no headache, no backache, no apparent nausea or vomiting, patient able to bend at knees and spinal receding Anesthetic complications: no    Last Vitals:  Vitals:   02/14/18 1615 02/14/18 1642  BP: 115/70 117/67  Pulse: 68 67  Resp: (!) 25 18  Temp: 36.4 C (!) 36.3 C  SpO2:  100%    Last Pain:  Vitals:   02/14/18 1645  TempSrc:   PainSc: 2    Pain Goal: Patients Stated Pain Goal: 3 (02/14/18 1645)               [name]

## 2018-02-15 ENCOUNTER — Other Ambulatory Visit: Payer: Self-pay

## 2018-02-15 ENCOUNTER — Encounter (HOSPITAL_COMMUNITY): Payer: Self-pay | Admitting: Obstetrics & Gynecology

## 2018-02-15 LAB — CBC
HEMATOCRIT: 34.2 % — AB (ref 36.0–46.0)
Hemoglobin: 11.8 g/dL — ABNORMAL LOW (ref 12.0–15.0)
MCH: 31.2 pg (ref 26.0–34.0)
MCHC: 34.5 g/dL (ref 30.0–36.0)
MCV: 90.5 fL (ref 78.0–100.0)
Platelets: 162 10*3/uL (ref 150–400)
RBC: 3.78 MIL/uL — ABNORMAL LOW (ref 3.87–5.11)
RDW: 13.4 % (ref 11.5–15.5)
WBC: 11.1 10*3/uL — ABNORMAL HIGH (ref 4.0–10.5)

## 2018-02-15 LAB — BIRTH TISSUE RECOVERY COLLECTION (PLACENTA DONATION)

## 2018-02-15 MED ORDER — OXYCODONE HCL 5 MG PO TABS: 10.0000 mg | ORAL_TABLET | ORAL | Status: DC | PRN

## 2018-02-15 MED ORDER — OXYCODONE HCL 5 MG PO TABS
5.0000 mg | ORAL_TABLET | ORAL | Status: DC | PRN
  Administered 2018-02-15 – 2018-02-16 (×4): 5 mg via ORAL
  Filled 2018-02-15 (×4): qty 1

## 2018-02-15 NOTE — Plan of Care (Signed)
  Problem: Activity: Goal: Ability to tolerate increased activity will improve Outcome: Completed/Met  Pt ambulating frequently in the room without assistance.  Pt encouraged to gradually increase her ambulation and to ambulate around the unit at least 3 times daily.  Pt verbalized understanding.

## 2018-02-15 NOTE — Anesthesia Postprocedure Evaluation (Signed)
Anesthesia Post Note  Patient: Regina French  Procedure(s) Performed: Repeat CESAREAN SECTION (N/A )     Patient location during evaluation: Mother Baby Anesthesia Type: Spinal Level of consciousness: oriented and awake and alert Pain management: pain level controlled Vital Signs Assessment: post-procedure vital signs reviewed and stable Respiratory status: spontaneous breathing, respiratory function stable and patient connected to nasal cannula oxygen Cardiovascular status: blood pressure returned to baseline and stable Postop Assessment: no headache, no backache and no apparent nausea or vomiting Anesthetic complications: no    Last Vitals:  Vitals:   02/15/18 0205 02/15/18 0628  BP: 94/63 99/66  Pulse: (!) 59 61  Resp: 18 18  Temp: 36.6 C 36.7 C  SpO2: 98% 100%    Last Pain:  Vitals:   02/15/18 0735  TempSrc:   PainSc: 0-No pain   Pain Goal: Patients Stated Pain Goal: 3 (02/14/18 1645)               Rayvon Char

## 2018-02-15 NOTE — Addendum Note (Signed)
Addendum  created 02/15/18 0803 by Rayvon Char, CRNA   Sign clinical note

## 2018-02-15 NOTE — Progress Notes (Signed)
Patient ID: Regina French, female   DOB: 21-Nov-1985, 32 y.o.   MRN: [id number] Subjective: S/P sched R C/S POD# 1 Information for the patient's newborn:  Cire, Deyarmin [[id number]]  female    circ planned w/ Dr. Allen Derry name: Regina French  Reports feeling well. Feeding: breast and bottle Patient reports tolerating PO.  Breast symptoms: flat nipples, not really wanting to breastfeed Pain controlled with PO meds Denies HA/SOB/C/P/N/V/dizziness. Flatus present. She reports vaginal bleeding as normal, without clots.  She is ambulating, urinating without difficulty.     Objective:   VS:    Vitals:   02/14/18 2155 02/15/18 0205 02/15/18 0628 02/15/18 1015  BP:  94/63 99/66 (!) 92/51  Pulse:  (!) 59 61 (!) 52  Resp:  18 18 16   Temp: 98.8 F (37.1 C) 97.8 F (36.6 C) 98.1 F (36.7 C) 97.6 F (36.4 C)  TempSrc: Oral Oral Oral Oral  SpO2: 98% 98% 100% 98%  Weight:      Height:          Intake/Output Summary (Last 24 hours) at 02/15/2018 1146 Last data filed at 02/15/2018 1015 Gross per 24 hour  Intake 4105.71 ml  Output 3289 ml  Net 816.71 ml        Recent Labs    02/13/18 0945 02/15/18 0621  WBC 8.3 11.1*  HGB 13.1 11.8*  HCT 38.3 34.2*  PLT 182 162     Blood type: --/--/O POS (07/11 0945)  Rubella: Immune (01/04 0000)     Physical Exam:  General: alert, cooperative and no distress CV: Regular rate and rhythm Resp: clear Abdomen: soft, nontender, normal bowel sounds, + gas distention Incision: intact and serous drainage present Uterine Fundus: firm, below umbilicus, nontender Lochia: minimal Ext: no edema, redness or tenderness in the calves or thighs      Assessment/Plan: 32 y.o.   POD# 1. [id number]                  Active Problems:   R C/S 7/12   Postpartum care following cesarean delivery    Doing well, stable.               Advance diet as tolerated Encourage rest when baby rests Breastfeeding support if desires, encouraged use pump and feed  if difficulty latching on Encourage to ambulate Routine post-op care  Juliene Pina, CNM, MSN 02/15/2018, 11:46 AM

## 2018-02-15 NOTE — Progress Notes (Signed)
Mother of baby was referred for anxiety. Referral screened out by CSW because per chart review, MOB was feeling anxieties about "real world events" in April of 2019 in regards to her children's future at her prenatal appointment. MOB denied therapy and medication at that time, and Buspar option was discussed with her OB. MOB is undiagnosed and the instance only occurred as a single episode.   Please contact CSW if mother of baby requests, if needs arise, or if mother of baby scores greater than a nine or answers yes to question ten on [place] Postpartum Depression Screen.   Madilyn Fireman, MSW, [place] Social Worker [facility] [phone]

## 2018-02-15 NOTE — Progress Notes (Signed)
Pt reports pain is increasing to 5/10 soreness. RN unable to give percocet ordered d/t pt's previous order for 1000mg  tylenol q6 for 24 hours.  Derrell Lolling CNM notified and gave verbal order for oxy IR 5mg  for moderate pain and 10mg  for severe pain every 4 hours prn.

## 2018-02-16 ENCOUNTER — Encounter (HOSPITAL_COMMUNITY): Payer: Self-pay | Admitting: Student

## 2018-02-16 MED ORDER — SIMETHICONE 80 MG PO CHEW: 80.0000 mg | CHEWABLE_TABLET | ORAL | 0 refills | Status: DC | PRN

## 2018-02-16 MED ORDER — COCONUT OIL OIL: 1.0000 "application " | TOPICAL_OIL | 0 refills | Status: DC | PRN

## 2018-02-16 MED ORDER — SENNOSIDES-DOCUSATE SODIUM 8.6-50 MG PO TABS: 2.0000 | ORAL_TABLET | ORAL | Status: DC

## 2018-02-16 MED ORDER — IBUPROFEN 600 MG PO TABS: 600.0000 mg | ORAL_TABLET | Freq: Four times a day (QID) | ORAL | 0 refills | Status: DC

## 2018-02-16 MED ORDER — OXYCODONE HCL 10 MG PO TABS: 10.0000 mg | ORAL_TABLET | ORAL | 0 refills | Status: DC | PRN

## 2018-02-16 MED ORDER — OXYCODONE HCL 5 MG PO TABS: 5.0000 mg | ORAL_TABLET | ORAL | 0 refills | Status: DC | PRN

## 2018-02-16 NOTE — Progress Notes (Signed)
Subjective: POD# 2 Information for the patient's newborn:  Brantlee, Hinde  female   Name Regina French Circumcision Completed by Dr. Benjie Karvonen on 7/14  Reports feeling good and ready to go home Feeding: Bottle. States she planned to feed colostrum for first 2-3 days and of life and  Transition to exclusive bottle feeding after discharge.  Patient reports tolerating PO and denies N/V.  Breast symptoms: soft Pain controlled with PO meds Denies HA/SOB/dizziness.  Flatus: passing Vaginal bleeding is normal, w/o clots. Ambulating and urinating w/o difficulty     Objective:  VS:    Vitals:   02/15/18 1015 02/15/18 1430 02/15/18 2202 02/16/18 0500  BP: (!) 92/51 103/62 (!) 99/53 108/75  Pulse: (!) 52 61 72 (!) 55  Resp: 16 16 16 16   Temp: 97.6 F (36.4 C) 98.6 F (37 C) 98 F (36.7 C) 98.1 F (36.7 C)  TempSrc: Oral Oral Oral   SpO2: 98% 100% 100% 100%  Weight:      Height:        No intake or output data in the 24 hours ending 02/16/18 1026     Recent Labs    02/15/18 0621  WBC 11.1*  HGB 11.8*  HCT 34.2*  PLT 162    Blood type: --/--/O POS (07/11 0945) Rubella: Immune (01/04 0000)    Physical Exam:  General: alert, cooperative and no distress Abdomen: soft, nontender, normal bowel sounds Incision: Original Honeycomb dressing and steristrips removed. Incision well-approximated and no drainaged noted. New Honeycomb dressing applied.  Uterine Fundus: firm, below umbilicus, nontender Lochia: minimal Ext: Trace edema to BLE, no calf tenderness, pain, or cords.   Assessment/Plan: 32 y.o.   POD# 2. [id number]                  1. Repeat C/S -normal POD #2 exam -D/C home -routine PP care -remove Honeycomb dressing @ home in 3 days  2. Hx of HSV -completed antenatal prophylaxis  -D/C Valtrex -notify Provider for symptoms  3. Bottle feeding -advised tight fitting bra -reduce stimulation of breast to avoid lactation  4.Dicarge today -follow-up @ [facility] in 6 weeks and  PRN  Arrie Eastern, SNM 02/16/2018, 10:26 AM

## 2018-02-16 NOTE — Discharge Summary (Addendum)
OB Discharge Summary     Patient Name: Regina French DOB: 05-Nov-1985 MRN: [id number]  Date of admission: 02/14/2018 Delivering MD: MODY, VAISHALI   Date of discharge: 02/16/2018  Admitting diagnosis: Previous Cesarean Section Intrauterine pregnancy: [redacted]w[redacted]d     Secondary diagnosis:  Active Problems:   R C/S 7/12   Postpartum care following cesarean delivery   Discharge diagnosis:  Patient Active Problem List   Diagnosis Date Noted  . R C/S 7/12 07/13/2014    Priority: Medium  . Postpartum care following cesarean delivery  07/13/2014                                  Complications: None  Hospital course:  Sceduled C/S   32 y.o. yo G2P2002 at [redacted]w[redacted]d was admitted to the hospital 02/14/2018 for scheduled cesarean section with the following indication:Elective Repeat.  Membrane Rupture Time/Date: 2:18 PM ,02/14/2018   Patient delivered a Viable infant.02/14/2018  Details of operation can be found in separate operative note.  Pateint had an uncomplicated postpartum course.  She is ambulating, tolerating a regular diet, passing flatus, and urinating well. Patient is discharged home in stable condition on  02/16/18         Physical exam  Vitals:   02/15/18 1015 02/15/18 1430 02/15/18 2202 02/16/18 0500  BP: (!) 92/51 103/62 (!) 99/53 108/75  Pulse: (!) 52 61 72 (!) 55  Resp: 16 16 16 16   Temp: 97.6 F (36.4 C) 98.6 F (37 C) 98 F (36.7 C) 98.1 F (36.7 C)  TempSrc: Oral Oral Oral   SpO2: 98% 100% 100% 100%  Weight:      Height:       General: alert, cooperative and no distress Lochia: appropriate Uterine Fundus: firm Incision: No significant erythema, dressing changed / old serosanguinous discharge DVT Evaluation: No cords or calf tenderness. No significant calf/ankle edema. Labs: Lab Results  Component Value Date   WBC 11.1 (H) 02/15/2018   HGB 11.8 (L) 02/15/2018   HCT 34.2 (L) 02/15/2018   MCV 90.5 02/15/2018   PLT 162 02/15/2018   No flowsheet data  found.  Discharge instruction: per After Visit Summary and "Baby and Me Booklet".  After visit meds:  Allergies as of 02/16/2018      Reactions   Penicillins Hives, Rash, Other (See Comments)   Has patient had a PCN reaction causing immediate rash, facial/tongue/throat swelling, SOB or lightheadedness with hypotension: Unknown Has patient had a PCN reaction causing severe rash involving mucus membranes or skin necrosis: Unknown Has patient had a PCN reaction that required hospitalization: Unknown Has patient had a PCN reaction occurring within the last 10 years: No If all of the above answers are "NO", then may proceed with Cephalosporin use.      Medication List    STOP taking these medications   hydrochlorothiazide 12.5 MG tablet Commonly known as:  HYDRODIURIL   iron polysaccharides 150 MG capsule Commonly known as:  NIFEREX   magnesium oxide 400 (241.3 Mg) MG tablet Commonly known as:  MAG-OX   oxyCODONE-acetaminophen 5-325 MG tablet Commonly known as:  PERCOCET/ROXICET   valACYclovir 500 MG tablet Commonly known as:  VALTREX     TAKE these medications   acetaminophen 500 MG tablet Commonly known as:  TYLENOL Take 1,000 mg by mouth every 6 (six) hours as needed for moderate pain or headache.   coconut oil Oil Apply 1 application topically  as needed.   ibuprofen 600 MG tablet Commonly known as:  ADVIL,MOTRIN Take 1 tablet (600 mg total) by mouth every 6 (six) hours.   oxyCODONE 5 MG immediate release tablet Commonly known as:  Oxy IR/ROXICODONE Take 1 tablet (5 mg total) by mouth every 4 (four) hours as needed for moderate pain.   prenatal multivitamin Tabs tablet Take 1 tablet by mouth daily.   senna-docusate 8.6-50 MG tablet Commonly known as:  Senokot-S Take 2 tablets by mouth daily. Start taking on:  02/17/2018   simethicone 80 MG chewable tablet Commonly known as:  MYLICON Chew 1 tablet (80 mg total) by mouth as needed for flatulence.       Diet:  routine diet  Activity: Advance as tolerated. Pelvic rest for 6 weeks.   Outpatient follow up:6 weeks Follow-up Information    Azucena Fallen, MD. Schedule an appointment as soon as possible for a visit in 6 week(s).   Specialty:  Obstetrics and Gynecology Contact information: [place] [address] [phone]          Postpartum contraception: Not Discussed  Newborn Data: Live born female  Birth Weight: 7 lb 15.2 oz (3605 g) APGAR: 67, 9  Newborn Delivery   Birth date/time:  02/14/2018 14:19:00 Delivery type:  C-Section, Low Transverse Trial of labor:  No C-section categorization:  Repeat     Baby Feeding: Breast Disposition:home with mother   02/16/2018 Juliene Pina, CNM

## 2018-03-11 DIAGNOSIS — O99345 Other mental disorders complicating the puerperium: Secondary | ICD-10-CM | POA: Diagnosis not present

## 2018-03-11 MED FILL — FLUoxetine HCL 20 MG CAPS: 20 | 30 days supply | Qty: 30 | Fill #0

## 2018-03-31 DIAGNOSIS — Z3202 Encounter for pregnancy test, result negative: Secondary | ICD-10-CM | POA: Diagnosis not present

## 2018-03-31 MED FILL — JULEBER 0.15-30 MG-MCG TABS: 0.15-30 | 84 days supply | Qty: 84 | Fill #0

## 2018-04-04 MED FILL — FLUoxetine HCL 20 MG CAPS: 20 | 90 days supply | Qty: 90 | Fill #0

## 2018-06-23 MED FILL — JULEBER 0.15-30 MG-MCG TABS: 0.15-30 | 84 days supply | Qty: 84 | Fill #1

## 2018-06-25 ENCOUNTER — Other Ambulatory Visit: Payer: Self-pay | Admitting: General Surgery

## 2018-06-25 DIAGNOSIS — R1907 Generalized intra-abdominal and pelvic swelling, mass and lump: Secondary | ICD-10-CM

## 2018-06-25 DIAGNOSIS — R222 Localized swelling, mass and lump, trunk: Secondary | ICD-10-CM

## 2018-06-30 MED FILL — FLUoxetine HCL 20 MG CAPS: 20 | 90 days supply | Qty: 90 | Fill #1

## 2018-07-01 ENCOUNTER — Ambulatory Visit
Admission: RE | Admit: 2018-07-01 | Discharge: 2018-07-01 | Disposition: A | Discharge status: Home / Self Care | Payer: 59 | Source: Ambulatory Visit | Attending: General Surgery | Admitting: General Surgery

## 2018-07-01 DIAGNOSIS — R222 Localized swelling, mass and lump, trunk: Secondary | ICD-10-CM

## 2018-07-01 DIAGNOSIS — R1011 Right upper quadrant pain: Secondary | ICD-10-CM | POA: Diagnosis not present

## 2018-07-01 MED ORDER — IOHEXOL 300 MG/ML  SOLN
100.0000 mL | Freq: Once | INTRAMUSCULAR | Status: AC | PRN
  Administered 2018-07-01: 100 mL via INTRAVENOUS

## 2018-07-24 MED FILL — BUTALB-ACETAMIN-CAFF 50-325: 50-325-40 | 60 days supply | Qty: 20 | Fill #0

## 2018-08-25 ENCOUNTER — Telehealth: Payer: 59 | Admitting: Family Medicine

## 2018-08-25 ENCOUNTER — Encounter: Payer: Self-pay | Admitting: Family Medicine

## 2018-08-25 DIAGNOSIS — R0981 Nasal congestion: Secondary | ICD-10-CM

## 2018-08-25 DIAGNOSIS — R509 Fever, unspecified: Secondary | ICD-10-CM

## 2018-08-25 NOTE — Progress Notes (Signed)
Regina French,  I apologize that your son is sick and that you don't feel well. It is important to get the diagnosis right so that the treatment is right. I hope you can understand that our guidelines for treatment are based on the questionnaire. Hoping you feel better soon.

## 2018-08-25 NOTE — Progress Notes (Signed)
Based on what you shared with me it looks like you have a condition that should be evaluated in a face to face office visit.  To adequately diagnose and treat an ear infection and the other symptoms you would benefit from face to face evaluation. Apologies that you could not be serviced on e-visits at this time. NOTE: If you entered your credit card information for this eVisit, you will not be charged. You may see a "hold" on your card for the $30 but that hold will drop off and you will not have a charge processed.  If you are having a true medical emergency please call 911.  If you need an urgent face to face visit, [place] has four urgent care centers for your convenience.  If you need care fast and have a high deductible or no insurance consider:   [email or web address] to reserve your spot online an avoid wait times  [facility] [address] [place], [place] [address] 8 am to 8 pm Monday-Friday 10 am to 4 pm Saturday-Sunday *Across the street from [employer]  [place], [address] 8 am to 5 pm Monday-Friday * In the [facility] on the [facility]   The following sites will take your  insurance:  . [facility] Urgent [place] a Provider at this Location  [address] [place], [place] [address] . 10 am to 8 pm Monday-Friday . 12 pm to 8 pm Saturday-Sunday   . [facility] at [place] a Provider at this Location  [address], [address], [place] [address] . 8 am to 8 pm Monday-Friday . 9 am to 6 pm Saturday . 11 am to 6 pm Sunday   . [facility] at [place] Get Driving Directions  [address] [place],  [address] . 8 am to 8 pm Monday-Friday . 8 am to 4 pm Saturday-Sunday   Your e-visit answers were  reviewed by a board certified advanced clinical practitioner to complete your personal care plan.  Thank you for using e-Visits.

## 2018-08-28 ENCOUNTER — Other Ambulatory Visit (HOSPITAL_COMMUNITY): Payer: Self-pay | Admitting: Orthopedic Surgery

## 2018-09-08 MED FILL — FLUoxetine HCL 20 MG CAPS: 20 | 90 days supply | Qty: 90 | Fill #2 | Status: TO

## 2018-09-11 MED FILL — ENSKYCE 0.15-30 MG-MCG TABS: 0.15-30 | 84 days supply | Qty: 84 | Fill #0 | Status: TO

## 2018-10-25 ENCOUNTER — Telehealth: Payer: 59 | Admitting: Family

## 2018-10-25 DIAGNOSIS — Z719 Counseling, unspecified: Secondary | ICD-10-CM

## 2018-10-25 NOTE — Progress Notes (Signed)
Thank you for the details you included in the comment boxes. Those details are very helpful in determining the best course of treatment for you and help [place] to provide the best care.  We are not permitted to treat anyone under the age of 60 years with the E-visit program. If he is in respiratory distress, as an Therapist, sports, you know I have to state the obvious: Take him to the ED. Aside from that, see below.   However, I will send the information to you below to ease your mind. In theory, if a person were to send me what you sent me for someone over the age of 70yrs, that person would receive the information below.   *You are not charged for this visit (may be a hold on card, but will go away)  E-Visit for [facility] Virus Screening  Based on your current symptoms, it seems unlikely that your symptoms are related to the Coronavirus.   Coronavirus disease 2019 (COVID-19) is a respiratory illness that can spread from person to person. The virus that causes COVID-19 is a new virus that was first identified in the country of [place] but is now found in multiple other countries and has spread to the [place].  Symptoms associated with the virus are mild to severe fever, cough, and shortness of breath. There is currently no vaccine to protect against COVID-19, and there is no specific antiviral treatment for the virus.   To be considered HIGH RISK for Coronavirus (COVID-19), you have to meet the following criteria:  . Traveled to [place], [place], [place], [place] or [place]; or in the [place] to [place], [place], [place], or [place]; and have fever, cough, and shortness of breath within the last 2 weeks of travel OR  . Been in close contact with a person diagnosed with COVID-19 within the last 2 weeks and have fever, cough, and shortness of breath  . IF YOU DO NOT MEET THESE CRITERIA, YOU ARE CONSIDERED LOW RISK FOR COVID-19.   It is vitally important that if you feel that you have an infection such  as this virus or any other virus that you stay home and away from places where you may spread it to others.  You should self-quarantine for 14 days if you have symptoms that could potentially be coronavirus and avoid contact with people age 35 and older.    You may also take acetaminophen (Tylenol) as needed for fever.   Reduce your risk of any infection by using the same precautions used for avoiding the common cold or flu:  Marland Kitchen Wash your hands often with soap and warm water for at least 20 seconds.  If soap and water are not readily available, use an alcohol-based hand sanitizer with at least 60% alcohol.  . If coughing or sneezing, cover your mouth and nose by coughing or sneezing into the elbow areas of your shirt or coat, into a tissue or into your sleeve (not your hands). . Avoid shaking hands with others and consider head nods or verbal greetings only. . Avoid touching your eyes, nose, or mouth with unwashed hands.  . Avoid close contact with people who are sick. . Avoid places or events with large numbers of people in one location, like concerts or sporting events. . Carefully consider travel plans you have or are making. . If you are planning any travel outside or inside the [place], visit the CDC's Travelers' Health webpage for the latest health notices. Marland Kitchen  If you have some symptoms but not all symptoms, continue to monitor at home and seek medical attention if your symptoms worsen. . If you are having a medical emergency, call 911.  HOME CARE . Only take medications as instructed by your medical team. . Drink plenty of fluids and get plenty of rest. . A steam or ultrasonic humidifier can help if you have congestion.   GET HELP RIGHT AWAY IF: . You develop worsening fever. . You become short of breath . You cough up blood. . Your symptoms become more severe MAKE SURE YOU   Understand these instructions.  Will watch your condition.  Will get help right away if you are not doing  well or get worse.  Your e-visit answers were reviewed by a board certified advanced clinical practitioner to complete your personal care plan.  Depending on the condition, your plan could have included both over the counter or prescription medications.  If there is a problem please reply once you have received a response from your provider. Your safety is important to [place].  If you have drug allergies check your prescription carefully.    You can use MyChart to ask questions about today's visit, request a non-urgent call back, or ask for a work or school excuse for 24 hours related to this e-Visit. If it has been greater than 24 hours you will need to follow up with your provider, or enter a new e-Visit to address those concerns. You will get an e-mail in the next two days asking about your experience.  I hope that your e-visit has been valuable and will speed your recovery. Thank you for using e-visits.

## 2018-11-12 MED FILL — RIZATRIPTAN 10 MG ODT: 10 | 30 days supply | Qty: 5 | Fill #0 | Status: TO

## 2018-11-12 MED FILL — BUTALBITAL-APAP-CAFFEINE 50: 50-325-40 | 2 days supply | Qty: 10 | Fill #0 | Status: TO

## 2018-11-29 ENCOUNTER — Telehealth: Payer: 59 | Admitting: Nurse Practitioner

## 2018-11-29 DIAGNOSIS — L249 Irritant contact dermatitis, unspecified cause: Secondary | ICD-10-CM | POA: Diagnosis not present

## 2018-11-29 MED ORDER — PREDNISONE 20 MG PO TABS: ORAL_TABLET | ORAL | 0 refills | Status: DC

## 2018-11-29 NOTE — Progress Notes (Signed)
E Visit for Rash  We are sorry that you are not feeling well. Here is how we plan to help!  Based on what you shared with me it looks like you have contact dermatitis.  Contact dermatitis is a skin rash caused by something that touches the skin and causes irritation or inflammation.  Your skin may be red, swollen, dry, cracked, and itch.  The rash should go away in a few days but can last a few weeks.  If you get a rash, it's important to figure out what caused it so the irritant can be avoided in the future. and I have prescribed Prednisone 20 mg 2 tablets daily for 5 days   HOME CARE:   Take cool showers and avoid direct sunlight.  Apply cool compress or wet dressings.  Take a bath in an oatmeal bath.  Sprinkle content of one Aveeno packet under running faucet with comfortably warm water.  Bathe for 15-20 minutes, 1-2 times daily.  Pat dry with a towel. Do not rub the rash.  Use hydrocortisone cream.  Take an antihistamine like Benadryl for widespread rashes that itch.  The adult dose of Benadryl is 25-50 mg by mouth 4 times daily.  Caution:  This type of medication may cause sleepiness.  Do not drink alcohol, drive, or operate dangerous machinery while taking antihistamines.  Do not take these medications if you have prostate enlargement.  Read package instructions thoroughly on all medications that you take.  GET HELP RIGHT AWAY IF:   Symptoms don't go away after treatment.  Severe itching that persists.  If you rash spreads or swells.  If you rash begins to smell.  If it blisters and opens or develops a yellow-brown crust.  You develop a fever.  You have a sore throat.  You become short of breath.  MAKE SURE YOU:  Understand these instructions. Will watch your condition. Will get help right away if you are not doing well or get worse.  Thank you for choosing an e-visit. Your e-visit answers were reviewed by a board certified advanced clinical practitioner to  complete your personal care plan. Depending upon the condition, your plan could have included both over the counter or prescription medications. Please review your pharmacy choice. Be sure that the pharmacy you have chosen is open so that you can pick up your prescription now.  If there is a problem you may message your provider in [place] to have the prescription routed to another pharmacy. Your safety is important to [place]. If you have drug allergies check your prescription carefully.  For the next 24 hours, you can use MyChart to ask questions about today's visit, request a non-urgent call back, or ask for a work or school excuse from your e-visit provider. You will get an email in the next two days asking about your experience. I hope that your e-visit has been valuable and will speed your recovery.   5-10 minutes spent reviewing and documenting in chart.

## 2019-03-25 MED FILL — FLUoxetine HCL 20 MG CAPS: 20 | 30 days supply | Qty: 30 | Fill #0

## 2019-04-06 ENCOUNTER — Other Ambulatory Visit: Payer: Self-pay

## 2019-04-06 DIAGNOSIS — Z20822 Contact with and (suspected) exposure to covid-19: Secondary | ICD-10-CM

## 2019-04-08 LAB — NOVEL CORONAVIRUS, NAA: SARS-CoV-2, NAA: NOT DETECTED

## 2019-06-02 NOTE — Progress Notes (Addendum)
[facility]    Provider:  Dr Jaynee Eagles Requesting Provider: Marda Stalker, PA-C Primary Care Provider:  Marda Stalker, PA-C  CC:  Migraines  HPI:  Regina French is a 33 y.o. female here as requested by Marda Stalker, PA-C for migraines. PMHx headaches and migraines, dependent edema.She started with migraines very young. She has unilateral symptoms, pulsating, pounding, throbbing, nausea, vomiting, light/sound/smell sensitivity. Smells can trigger, lack of food, she has daily headaches, 10 migraine days a month of which moderate or severe in intensity. No aura. No medication overuse. Tylenol and ibuprofen may help. Migraines can last 24-72 hours. Migraines worse during her menses. Improved during pregnancy. She has 2 children.  She will have [place] insurance soon. Ongoing for > 1 year at this frequency and severity. Interfering with her life and work, significant indirect costs, she is missing work as well. She has vision changes and loss of vision bilaterally peripherally, she wakes with headaches and they can be wore with sneezing or bending over. No other focal neurologic deficits, associated symptoms, inciting events or modifiable factors.  Medications tried: Zonegran, Fioricet, Prozac, Maxalt, magnesium, Zofran, topamax, nortriptyline, propranolol and verapamil(low blood pressure). EMgality with a bad rash(samples). Aimovig contraindicated due to constipation. Try Ajovy: Patient has copay card; she can have medication regardless of insurance approval or copay amount. She also had a reaction to Emgality and can't take aimovig, so I am sure a PA will be approved.  Reviewed notes, labs and imaging from outside physicians, which showed:  I reviewed Jayme Cloud notes, she has been seeing Dr. Orie Rout at the headache clinic for migraines starting in July 2017.  Was on Zonegran and getting lidocaine/Decadron injections of the trigeminal pathway.  Seem to  help some.  She may want to restart.  Seems that over the last several months during Covid she has noted more headaches associated with stress, wearing a mask.  Also associated nausea.  She has been researching the injectable medications.  I don't have any recent labs, I will request from her pcp Marda Stalker, PA  Review of Systems: Patient complains of symptoms per HPI as well as the following symptoms: headache. Pertinent negatives and positives per HPI. All others negative.   Social History   Socioeconomic History   Marital status: Significant Other    Spouse name: Not on file   Number of children: 2   Years of education: Not on file   Highest education level: Bachelor's degree (e.g., BA, AB, BS)  Occupational History    Comment: Merchant navy officer strain: Not on file   Food insecurity    Worry: Not on file    Inability: Not on file   Transportation needs    Medical: Not on file    Non-medical: Not on file  Tobacco Use   Smoking status: Former Smoker    Packs/day: 7.00    Quit date: 2018    Years since quitting: 2.8   Smokeless tobacco: Never Used   Tobacco comment: Pt states she smokes 7-10 cigarettes daily  Substance and Sexual Activity   Alcohol use: No    Comment: quit 2010   Drug use: No    Comment: 5 years ago 2010   Sexual activity: Yes  Lifestyle   Physical activity    Days per week: Not on file    Minutes per session: Not on file   Stress: Not on file  Relationships   Social connections  Talks on phone: Not on file    Gets together: Not on file    Attends religious service: Not on file    Active member of club or organization: Not on file    Attends meetings of clubs or organizations: Not on file    Relationship status: Not on file   Intimate partner violence    Fear of current or ex partner: Not on file    Emotionally abused: Not on file    Physically abused: Not on file    Forced sexual activity: Not on file   Other Topics Concern   Not on file  Social History Narrative   Lives at home with husband and 2 children   Caffeine: 1-2 cups daily    Family History  Problem Relation Age of Onset   Heart disease Maternal Grandfather    Cancer Mother        breast   Hyperthyroidism Mother    Birth defects Cousin        cleft palate   Migraines Niece     Past Medical History:  Diagnosis Date   Anxiety    Dependent edema 07/16/2014   Headache    Herpes    Hx of varicella    Postpartum care following cesarean delivery with excision of LT dermoid cyst (12/8) 07/13/2014   Substance abuse ([place])     Patient Active Problem List   Diagnosis Date Noted   Chronic migraine without aura, with intractable migraine, so stated, with status migrainosus 06/03/2019   Cervicalgia 06/03/2019   Cephalalgia 06/03/2019   Postpartum care following cesarean delivery  07/13/2014   R C/S 7/12 07/13/2014    Past Surgical History:  Procedure Laterality Date   CESAREAN SECTION N/A 07/13/2014   Procedure: CESAREAN SECTION;  Surgeon: Elveria Royals, MD;  Location: [facility];  Service: Obstetrics;  Laterality: N/A;   CESAREAN SECTION N/A 02/14/2018   Procedure: Repeat CESAREAN SECTION;  Surgeon: Azucena Fallen, MD;  Location: [facility];  Service: Obstetrics;  Laterality: N/A;  EDD: 02/21/18 Allergy: Penicillin Time ok per Lattie Haw.   NO PAST SURGERIES      Current Outpatient Medications  Medication Sig Dispense Refill   acetaminophen (TYLENOL) 500 MG tablet Take 1,000 mg by mouth every 6 (six) hours as needed for moderate pain or headache.      APRI 0.15-30 MG-MCG tablet Take 1 tablet by mouth daily.     butalbital-acetaminophen-caffeine (FIORICET) 50-325-40 MG tablet Take 1 tablet by mouth every 4 (four) hours as needed.     FLUoxetine (PROZAC) 20 MG capsule Take 20 mg by mouth daily.     ibuprofen (ADVIL) 800 MG tablet Take 800 mg by mouth as needed.     Naproxen Sodium (ALEVE PO) Take by mouth daily.      rizatriptan (MAXALT-MLT) 10 MG disintegrating tablet PLEASE SEE ATTACHED FOR DETAILED DIRECTIONS     zonisamide (ZONEGRAN) 100 MG capsule Take 100 mg by mouth at bedtime.     Rimegepant Sulfate (NURTEC) 75 MG TBDP Take 75 mg by mouth daily as needed. For migraines. Take as close to onset of migraine as possible. One daily maximum. 10 tablet 6   SUMAtriptan (TOSYMRA) 10 MG/ACT SOLN Place 1 spray into the nose every hour as needed. 6 each 11   SUMAtriptan Succinate (ZEMBRACE SYMTOUCH) 3 MG/0.5ML SOAJ Inject 3 mg into the skin once as needed for up to 1 dose. May repeat in 15 minutes. If symptoms persist, repeat in 2 hours.  Max 4 injections daily. 8 pen 11   Ubrogepant (UBRELVY) 50 MG TABS Take 100 mg by mouth every 2 (two) hours as needed. Max 200mg  a day 10 tablet 6   No current facility-administered medications for this visit.     Allergies as of 06/03/2019 - Review Complete 06/03/2019  Allergen Reaction Noted   Penicillins Hives, Rash, and Other (See Comments) 09/21/2011    Vitals: BP 111/77 (BP Location: Right Arm, Patient Position: Sitting)    Pulse 77    Temp 98.6 F (37 C)    Ht 5\' 4"  (1.626 m)    Wt 142 lb (64.4 kg)    BMI 24.37 kg/m  Last Weight:  Wt Readings from Last 1 Encounters:  06/03/19 142 lb (64.4 kg)   Last Height:   Ht Readings from Last 1 Encounters:  06/03/19 5\' 4"  (1.626 m)     Physical exam: Exam: Gen: NAD, conversant, well nourised, well groomed                     CV: RRR, no MRG. No Carotid Bruits. No peripheral edema, warm, nontender Eyes: Conjunctivae clear without exudates or hemorrhage  Neuro: Detailed Neurologic Exam  Speech:    Speech is normal; fluent and spontaneous with normal comprehension.  Cognition:    The patient is oriented to person, place, and time;     recent and remote memory intact;     language fluent;     normal attention, concentration,     fund of knowledge Cranial Nerves:    The pupils are equal, round, and reactive to  light. The fundi are normal and spontaneous venous pulsations are present. Visual fields are full to finger confrontation. Extraocular movements are intact. Trigeminal sensation is intact and the muscles of mastication are normal. The face is symmetric. The palate elevates in the midline. Hearing intact. Voice is normal. Shoulder shrug is normal. The tongue has normal motion without fasciculations.   Coordination:    Normal finger to nose and heel to shin. Normal rapid alternating movements.   Gait:    Heel-toe and tandem gait are normal.   Motor Observation:    No asymmetry, no atrophy, and no involuntary movements noted. Tone:    Normal muscle tone.    Posture:    Posture is normal. normal erect    Strength:    Strength is V/V in the upper and lower limbs.      Sensation: intact to LT     Reflex Exam:  DTR's:    Deep tendon reflexes in the upper and lower extremities are normal bilaterally.   Toes:    The toes are downgoing bilaterally.   Clonus:    Clonus is absent.    Assessment/Plan:  33 year old with chronic migraines however given concerning symptoms she needs a thorough evaluation.  MRI brain: MRI brain due to concerning symptoms of morning headaches, exertional headache positional headaches,vision changes  to look for space occupying mass, chiari or intracranial hypertension (pseudotumor), strokes, demyelination (MS)  Dry needling - [address] - cervical myofascial pain syndrome, cervicalgia, chronic migraines  Start Botox for chronic miraines: preventative For acute, gave her several samples: see meds ordered below  Patient has copay card; she can have medication regardless of insurance approval or copay amount. She also had a reaction to Emgality and can't take aimovig, so I am sure a PA will be approved.  Medications tried: Zonegran, Fioricet, Prozac, Maxalt, magnesium, Zofran, topamax, nortriptyline,  propranolol and verapamil(low blood pressure)  Discussed  Ajovy  Discussed: To prevent or relieve headaches, try the following: Cool Compress. Lie down and place a cool compress on your head.  Avoid headache triggers. If certain foods or odors seem to have triggered your migraines in the past, avoid them. A headache diary might help you identify triggers.  Include physical activity in your daily routine. Try a daily walk or other moderate aerobic exercise.  Manage stress. Find healthy ways to cope with the stressors, such as delegating tasks on your to-do list.  Practice relaxation techniques. Try deep breathing, yoga, massage and visualization.  Eat regularly. Eating regularly scheduled meals and maintaining a healthy diet might help prevent headaches. Also, drink plenty of fluids.  Follow a regular sleep schedule. Sleep deprivation might contribute to headaches Consider biofeedback. With this mind-body technique, you learn to control certain bodily functions -- such as muscle tension, heart rate and blood pressure -- to prevent headaches or reduce headache pain.    Proceed to emergency room if you experience new or worsening symptoms or symptoms do not resolve, if you have new neurologic symptoms or if headache is severe, or for any concerning symptom.   Provided education and documentation from American headache Society toolbox including articles on: chronic migraine medication overuse headache, chronic migraines, prevention of migraines, behavioral and other nonpharmacologic treatments for headache.  Do not use different  triptans in the same day Do not use ubrelvy and nurtec in the same day Call if you would like a prescription for any of these  Orders Placed This Encounter  Procedures   MR BRAIN W WO CONTRAST   Ambulatory referral to Physical Therapy   Meds ordered this encounter  Medications   SUMAtriptan (TOSYMRA) 10 MG/ACT SOLN    Sig: Place 1 spray into the nose every hour as needed.    Dispense:  6 each    Refill:  11    Patient  has copay card; she can have medication regardless of insurance approval or copay amount.   Ubrogepant (UBRELVY) 50 MG TABS    Sig: Take 100 mg by mouth every 2 (two) hours as needed. Max 200mg  a day    Dispense:  10 tablet    Refill:  6   Rimegepant Sulfate (NURTEC) 75 MG TBDP    Sig: Take 75 mg by mouth daily as needed. For migraines. Take as close to onset of migraine as possible. One daily maximum.    Dispense:  10 tablet    Refill:  6   SUMAtriptan Succinate (ZEMBRACE SYMTOUCH) 3 MG/0.5ML SOAJ    Sig: Inject 3 mg into the skin once as needed for up to 1 dose. May repeat in 15 minutes. If symptoms persist, repeat in 2 hours. Max 4 injections daily.    Dispense:  8 pen    Refill:  11    Cc: Marda Stalker, PA-C,    Sarina Ill, MD  [facility] [address] [place], [place] [address]  Phone [phone] Fax [phone]

## 2019-06-03 ENCOUNTER — Encounter: Payer: Self-pay | Admitting: Neurology

## 2019-06-03 ENCOUNTER — Ambulatory Visit: Payer: BC Managed Care – PPO | Admitting: Neurology

## 2019-06-03 ENCOUNTER — Telehealth: Payer: Self-pay | Admitting: Neurology

## 2019-06-03 VITALS — BP 111/77 | HR 77 | Temp 98.6°F | Ht 64.0 in | Wt 142.0 lb

## 2019-06-03 DIAGNOSIS — H53453 Other localized visual field defect, bilateral: Secondary | ICD-10-CM

## 2019-06-03 DIAGNOSIS — M542 Cervicalgia: Secondary | ICD-10-CM | POA: Insufficient documentation

## 2019-06-03 DIAGNOSIS — G43711 Chronic migraine without aura, intractable, with status migrainosus: Secondary | ICD-10-CM

## 2019-06-03 DIAGNOSIS — G441 Vascular headache, not elsewhere classified: Secondary | ICD-10-CM

## 2019-06-03 DIAGNOSIS — M7918 Myalgia, other site: Secondary | ICD-10-CM | POA: Diagnosis not present

## 2019-06-03 DIAGNOSIS — R519 Headache, unspecified: Secondary | ICD-10-CM

## 2019-06-03 DIAGNOSIS — G4484 Primary exertional headache: Secondary | ICD-10-CM

## 2019-06-03 DIAGNOSIS — R51 Headache with orthostatic component, not elsewhere classified: Secondary | ICD-10-CM

## 2019-06-03 MED ORDER — ZEMBRACE SYMTOUCH 3 MG/0.5ML ~~LOC~~ SOAJ: 3.0000 mg | Freq: Once | SUBCUTANEOUS | 11 refills | Status: DC | PRN

## 2019-06-03 MED ORDER — UBRELVY 50 MG PO TABS: 100.0000 mg | ORAL_TABLET | ORAL | 6 refills | Status: DC | PRN

## 2019-06-03 MED ORDER — TOSYMRA 10 MG/ACT NA SOLN: 1.0000 | NASAL | 11 refills | Status: DC | PRN

## 2019-06-03 MED ORDER — NURTEC 75 MG PO TBDP: 75.0000 mg | ORAL_TABLET | Freq: Every day | ORAL | 6 refills | Status: DC | PRN

## 2019-06-03 NOTE — Telephone Encounter (Signed)
Danielle, Please initiate botox approval for patient and call patient with information. thanks

## 2019-06-03 NOTE — Patient Instructions (Addendum)
Sumatriptan injection What is this medicine? SUMATRIPTAN (soo ma TRIP tan) is used to treat migraines with or without aura. An aura is a strange feeling or visual disturbance that warns you of an attack. It is not used to prevent migraines. This medicine may be used for other purposes; ask your health care provider or pharmacist if you have questions. COMMON BRAND NAME(S): Alsuma, Imitrex, Imitrex STAT dose, Sumavel DosePro System What should I tell my health care provider before I take this medicine? They need to know if you have any of these conditions:  cigarette smoker  circulation problems in fingers and toes  diabetes  heart disease  high blood pressure  high cholesterol  history of irregular heartbeat  history of stroke  kidney disease  liver disease  stomach or intestine problems  an unusual or allergic reaction to sumatriptan, latex, other medicines, foods, dyes, or preservatives  pregnant or trying to get pregnant  breast-feeding How should I use this medicine? This medicine is for injection under the skin. You will be taught how to prepare and give this medicine. Use exactly as directed. Do not take your medicine more often than directed. Talk to your pediatrician regarding the use of this medicine in children. Special care may be needed. Overdosage: If you think you have taken too much of this medicine contact a poison control center or emergency room at once. NOTE: This medicine is only for you. Do not share this medicine with others. What if I miss a dose? This does not apply. This medicine is not for regular use. What may interact with this medicine? Do not take this medicine with any of the following medicines:  certain medicines for migraine headache like almotriptan, eletriptan, frovatriptan, naratriptan, rizatriptan, sumatriptan, zolmitriptan  ergot alkaloids like dihydroergotamine, ergonovine, ergotamine, methylergonovine  MAOIs like Carbex,  Eldepryl, Marplan, Nardil, and Parnate This medicine may also interact with the following medications:  certain medicines for depression, anxiety, or psychotic disorders This list may not describe all possible interactions. Give your health care provider a list of all the medicines, herbs, non-prescription drugs, or dietary supplements you use. Also tell them if you smoke, drink alcohol, or use illegal drugs. Some items may interact with your medicine. What should I watch for while using this medicine? Visit your healthcare professional for regular checks on your progress. Tell your healthcare professional if your symptoms do not start to get better or if they get worse. You may get drowsy or dizzy. Do not drive, use machinery, or do anything that needs mental alertness until you know how this medicine affects you. Do not stand up or sit up quickly, especially if you are an older patient. This reduces the risk of dizzy or fainting spells. Alcohol may interfere with the effect of this medicine. Tell your healthcare professional right away if you have any change in your eyesight. If you take migraine medicines for 10 or more days a month, your migraines may get worse. Keep a diary of headache days and medicine use. Contact your healthcare professional if your migraine attacks occur more frequently. What side effects may I notice from receiving this medicine? Side effects that you should report to your doctor or health care professional as soon as possible:  allergic reactions like skin rash, itching or hives, swelling of the face, lips, or tongue  changes in vision  chest pain or chest tightness  signs and symptoms of a dangerous change in heartbeat or heart rhythm like chest pain;  dizziness; fast, irregular heartbeat; palpitations; feeling faint or lightheaded; falls; breathing problems  signs and symptoms of a stroke like changes in vision; confusion; trouble speaking or understanding; severe  headaches; sudden numbness or weakness of the face, arm or leg; trouble walking; dizziness; loss of balance or coordination  signs and symptoms of serotonin syndrome like irritable; confusion; diarrhea; fast or irregular heartbeat; muscle twitching; stiff muscles; trouble walking; sweating; high fever; seizures; chills; vomiting Side effects that usually do not require medical attention (report to your doctor or health care professional if they continue or are bothersome):  diarrhea  dizziness  drowsiness  dry mouth  headache  nausea, vomiting  pain, redness, or irritation at site where injected  pain, tingling, numbness in the hands or feet  stomach pain This list may not describe all possible side effects. Call your doctor for medical advice about side effects. You may report side effects to FDA at 1-800-FDA-1088. Where should I keep my medicine? Keep out of the reach of children. You will be instructed on how to store this medicine. Throw away any unused medicine after the expiration date on the label. NOTE: This sheet is a summary. It may not cover all possible information. If you have questions about this medicine, talk to your doctor, pharmacist, or health care provider.  2020 Elsevier/Gold Standard (2017-12-06 12:58:21) Rolanda Lundborg injection What is this medicine? FREMANEZUMAB (fre ma NEZ ue mab) is used to prevent migraine headaches. This medicine may be used for other purposes; ask your health care provider or pharmacist if you have questions. COMMON BRAND NAME(S): AJOVY What should I tell my health care provider before I take this medicine? They need to know if you have any of these conditions:  an unusual or allergic reaction to fremanezumab, other medicines, foods, dyes, or preservatives  pregnant or trying to get pregnant  breast-feeding How should I use this medicine? This medicine is for injection under the skin. You will be taught how to prepare and give  this medicine. Use exactly as directed. Take your medicine at regular intervals. Do not take your medicine more often than directed. It is important that you put your used needles and syringes in a special sharps container. Do not put them in a trash can. If you do not have a sharps container, call your pharmacist or healthcare provider to get one. Talk to your pediatrician regarding the use of this medicine in children. Special care may be needed. Overdosage: If you think you have taken too much of this medicine contact a poison control center or emergency room at once. NOTE: This medicine is only for you. Do not share this medicine with others. What if I miss a dose? If you miss a dose, take it as soon as you can. If it is almost time for your next dose, take only that dose. Do not take double or extra doses. What may interact with this medicine? Interactions are not expected. This list may not describe all possible interactions. Give your health care provider a list of all the medicines, herbs, non-prescription drugs, or dietary supplements you use. Also tell them if you smoke, drink alcohol, or use illegal drugs. Some items may interact with your medicine. What should I watch for while using this medicine? Tell your doctor or healthcare professional if your symptoms do not start to get better or if they get worse. What side effects may I notice from receiving this medicine? Side effects that you should report to your doctor  or health care professional as soon as possible:  allergic reactions like skin rash, itching or hives, swelling of the face, lips, or tongue Side effects that usually do not require medical attention (report these to your doctor or health care professional if they continue or are bothersome):  pain, redness, or irritation at site where injected This list may not describe all possible side effects. Call your doctor for medical advice about side effects. You may report side  effects to FDA at 1-800-FDA-1088. Where should I keep my medicine? Keep out of the reach of children. You will be instructed on how to store this medicine. Throw away any unused medicine after the expiration date on the label. NOTE: This sheet is a summary. It may not cover all possible information. If you have questions about this medicine, talk to your doctor, pharmacist, or health care provider.  2020 Elsevier/Gold Standard (2017-04-22 17:22:56)

## 2019-06-04 ENCOUNTER — Telehealth: Payer: Self-pay | Admitting: *Deleted

## 2019-06-04 NOTE — Telephone Encounter (Signed)
I called the patient to schedule, she did not have her schedule on her and was going to call me back.

## 2019-06-04 NOTE — Telephone Encounter (Signed)
Botox charge sheet ready for MD signature. Dx: [id number]. Pt will need to sign consent at first injection appt.

## 2019-06-04 NOTE — Telephone Encounter (Signed)
Completed Nurtec PA on CMM. KEY: Key: [id number]. Awaiting [facility] determination. Requirement that pt tries and fails at least 2 triptans. Pt has only tried one.   Per CMM: "If [employer] has not responded to your request within 24 hours, contact [place] at [phone]. If you think there may be a problem with your PA request, use our live chat feature at the bottom right."

## 2019-06-04 NOTE — Telephone Encounter (Signed)
Botox charge sheet signed by Dr. Jaynee Eagles and given to [facility].

## 2019-06-08 ENCOUNTER — Encounter: Payer: Self-pay | Admitting: *Deleted

## 2019-06-08 NOTE — Telephone Encounter (Signed)
Per [facility], Nurtec denied d/t not meeting requirements. Pt required to try/fail two triptan drugs. Pt should still be able to use a savings card. If we should choose to appeal, fax to [phone].   Sent pt [employer].

## 2019-06-10 ENCOUNTER — Telehealth: Payer: Self-pay | Admitting: Neurology

## 2019-06-10 NOTE — Telephone Encounter (Signed)
LVM for pt to call back about scheduling [id number] (exp 06/08/19 - 12/04/19)

## 2019-06-10 NOTE — Telephone Encounter (Signed)
Patient returned my call. She informed me she is going to get new insurance on Nov 17 and will get a new schedule then. Since the MRI is going to cost her $1.543.28 she is going to hold off until she gets her new insurance and then will call back to schedule.  She also stated that she has been approved for Botox and was wondering if she could come in on Monday or Wednesday.

## 2019-06-10 NOTE — Telephone Encounter (Signed)
I called the patient back, she is going to hold off on injections until she gets her new insurance. She will call us back to schedule. DW

## 2019-06-15 ENCOUNTER — Ambulatory Visit: Payer: BC Managed Care – PPO | Admitting: Physical Therapy

## 2019-08-14 MED FILL — FLUoxetine HCL 20 MG CAPS: 20 | 30 days supply | Qty: 30 | Fill #0

## 2019-08-14 MED FILL — JULEBER 0.15-30 MG-MCG TABS: 0.15-30 | 84 days supply | Qty: 84 | Fill #0

## 2019-08-20 ENCOUNTER — Telehealth: Payer: Self-pay

## 2019-08-20 NOTE — Telephone Encounter (Signed)
Please disregard, patient has changed coverage to [facility], I will check with them.

## 2019-08-20 NOTE — Telephone Encounter (Signed)
I called [employer] and started a pre cert request for Botox injections in office. Faxes have been sent and I made the patient aware she would need a referral for Dr. Jaynee Eagles before coming in. DWD

## 2019-08-20 NOTE — Telephone Encounter (Signed)
Patient does not meet [employer] requirements because they want the patient to try and fail a CGRP antagonist first. Please advise.

## 2019-08-24 NOTE — Telephone Encounter (Signed)
[employer] called in and would like a call back regarding a PA, additional information is needed    CB# [phone]

## 2019-08-25 ENCOUNTER — Other Ambulatory Visit: Payer: Self-pay

## 2019-08-25 ENCOUNTER — Ambulatory Visit (INDEPENDENT_AMBULATORY_CARE_PROVIDER_SITE_OTHER): Payer: No Typology Code available for payment source | Admitting: Neurology

## 2019-08-25 ENCOUNTER — Other Ambulatory Visit: Payer: Self-pay | Admitting: Neurology

## 2019-08-25 VITALS — Temp 97.8°F

## 2019-08-25 DIAGNOSIS — G43711 Chronic migraine without aura, intractable, with status migrainosus: Secondary | ICD-10-CM | POA: Diagnosis not present

## 2019-08-25 MED ORDER — FLUOXETINE HCL 20 MG PO CAPS: 20.0000 mg | ORAL_CAPSULE | Freq: Every day | ORAL | 4 refills | Status: DC

## 2019-08-25 MED ORDER — APRI 0.15-30 MG-MCG PO TABS: 1.0000 | ORAL_TABLET | Freq: Every day | ORAL | 4 refills | Status: DC

## 2019-08-25 MED ORDER — TOSYMRA 10 MG/ACT NA SOLN: 1.0000 | NASAL | 11 refills | Status: DC | PRN

## 2019-08-25 MED FILL — JULEBER 0.15-30 MG-MCG TABS: 0.15-30 | 84 days supply | Qty: 84 | Fill #0

## 2019-08-25 MED FILL — FLUoxetine HCL 20 MG CAPS: 20 | 90 days supply | Qty: 90 | Fill #0

## 2019-08-25 NOTE — Progress Notes (Signed)
First botox.  Consent Form Botulism Toxin Injection For Chronic Migraine    Reviewed orally with patient, additionally signature is on file: She lloved the Tosymra. Nurtec and ubrelvy eh. Imitrex injection ok. Awesome patient.   Botulism toxin has been approved by the Federal drug administration for treatment of chronic migraine. Botulism toxin does not cure chronic migraine and it may not be effective in some patients.  The administration of botulism toxin is accomplished by injecting a small amount of toxin into the muscles of the neck and head. Dosage must be titrated for each individual. Any benefits resulting from botulism toxin tend to wear off after 3 months with a repeat injection required if benefit is to be maintained. Injections are usually done every 3-4 months with maximum effect peak achieved by about 2 or 3 weeks. Botulism toxin is expensive and you should be sure of what costs you will incur resulting from the injection.  The side effects of botulism toxin use for chronic migraine may include:   -Transient, and usually mild, facial weakness with facial injections  -Transient, and usually mild, head or neck weakness with head/neck injections  -Reduction or loss of forehead facial animation due to forehead muscle weakness  -Eyelid drooping  -Dry eye  -Pain at the site of injection or bruising at the site of injection  -Double vision  -Potential unknown long term risks  Contraindications: You should not have Botox if you are pregnant, nursing, allergic to albumin, have an infection, skin condition, or muscle weakness at the site of the injection, or have myasthenia gravis, Lambert-Eaton syndrome, or ALS.  It is also possible that as with any injection, there may be an allergic reaction or no effect from the medication. Reduced effectiveness after repeated injections is sometimes seen and rarely infection at the injection site may occur. All care will be taken to prevent these  side effects. If therapy is given over a long time, atrophy and wasting in the muscle injected may occur. Occasionally the patient's become refractory to treatment because they develop antibodies to the toxin. In this event, therapy needs to be modified.  I have read the above information and consent to the administration of botulism toxin.    BOTOX PROCEDURE NOTE FOR MIGRAINE HEADACHE    Contraindications and precautions discussed with patient(above). Aseptic procedure was observed and patient tolerated procedure. Procedure performed by Dr. Georgia Dom  The condition has existed for more than 6 months, and pt does not have a diagnosis of ALS, Myasthenia Gravis or Lambert-Eaton Syndrome.  Risks and benefits of injections discussed and pt agrees to proceed with the procedure.  Written consent obtained  These injections are medically necessary. Pt  receives good benefits from these injections. These injections do not cause sedations or hallucinations which the oral therapies may cause.  Description of procedure:  The patient was placed in a sitting position. The standard protocol was used for Botox as follows, with 5 units of Botox injected at each site:   -Procerus muscle, midline injection  -Corrugator muscle, bilateral injection  -Frontalis muscle, bilateral injection, with 2 sites each side, medial injection was performed in the upper one third of the frontalis muscle, in the region vertical from the medial inferior edge of the superior orbital rim. The lateral injection was again in the upper one third of the forehead vertically above the lateral limbus of the cornea, 1.5 cm lateral to the medial injection site.  -Temporalis muscle injection, 4 sites, bilaterally. The first injection  was 3 cm above the tragus of the ear, second injection site was 1.5 cm to 3 cm up from the first injection site in line with the tragus of the ear. The third injection site was 1.5-3 cm forward between the  first 2 injection sites. The fourth injection site was 1.5 cm posterior to the second injection site.   -Occipitalis muscle injection, 3 sites, bilaterally. The first injection was done one half way between the occipital protuberance and the tip of the mastoid process behind the ear. The second injection site was done lateral and superior to the first, 1 fingerbreadth from the first injection. The third injection site was 1 fingerbreadth superiorly and medially from the first injection site.  -Cervical paraspinal muscle injection, 2 sites, bilateral knee first injection site was 1 cm from the midline of the cervical spine, 3 cm inferior to the lower border of the occipital protuberance. The second injection site was 1.5 cm superiorly and laterally to the first injection site.  -Trapezius muscle injection was performed at 3 sites, bilaterally. The first injection site was in the upper trapezius muscle halfway between the inflection point of the neck, and the acromion. The second injection site was one half way between the acromion and the first injection site. The third injection was done between the first injection site and the inflection point of the neck.   Will return for repeat injection in 3 months.   200 units of Botox was used, any Botox not injected was wasted. The patient tolerated the procedure well, there were no complications of the above procedure.

## 2019-08-25 NOTE — Progress Notes (Signed)
Botox- 200 units x 2 vials Lot: [id number] Expiration: 04/2022 NDC: [id number]  Bacteriostatic 0.9% Sodium Chloride- 100mL total Lot: [id number] Expiration: 11/05/2019 NDC: [id number]  Dx: [id number] B/B

## 2019-08-25 NOTE — Telephone Encounter (Signed)
I spoke with the representative from [facility] Marcie Bal) who stated that this is requiring additional review because the patient did not have the MRI that was ordered for her last year. She is going to get back to me today for an answer.

## 2019-08-25 NOTE — Telephone Encounter (Signed)
I called the patient to make her aware of this, she has requested to keep her apt for OV even if she doesn't receive the Botox today. I told her I would keep her updated. DWD.

## 2019-08-27 ENCOUNTER — Telehealth: Payer: Self-pay | Admitting: *Deleted

## 2019-08-27 NOTE — Telephone Encounter (Signed)
Completed Tosymra PA on CMM. [id number]. Awaiting [employer] determination.

## 2019-09-01 ENCOUNTER — Telehealth: Payer: Self-pay | Admitting: Neurology

## 2019-09-01 MED FILL — TOSYMRA 10 MG/ACT SOLN: 10 | 30 days supply | Qty: 6 | Fill #0

## 2019-09-01 NOTE — Telephone Encounter (Signed)
Spoke with patient and discussed Tosymra denied by insurance d/t it not being a covered benefit. Pt has a copay card. She was advised this will work regardless of the denial and she should show the pharmacy the info on the back of the card. She verbalized appreciation. She will call back if she has any further issues.   If pt continues to have issues with local pharmacy and the saving card, we could send the [facility]ra [facility] where they have the savings program in place.

## 2019-09-01 NOTE — Telephone Encounter (Signed)
Pt called wanting to speak to RN about her SUMAtriptan (TOSYMRA) 10 MG/ACT SOLN Please advise.

## 2019-11-12 ENCOUNTER — Telehealth: Payer: Self-pay | Admitting: *Deleted

## 2019-11-12 NOTE — Telephone Encounter (Signed)
I called [facility] and spoke to [place].  She states [id number] and [id number] are billable.  [id number] will require PA.  States there is a PA on file already.  PA# is [id number] Valid from 08/07/2019-08/05/2020 for 4 visits.  Ref# for this call is [phone]

## 2019-11-20 MED FILL — FLUoxetine HCL 20 MG CAPS: 20 | 90 days supply | Qty: 90 | Fill #1

## 2019-11-20 MED FILL — JULEBER 0.15-30 MG-MCG TABS: 0.15-30 | 84 days supply | Qty: 84 | Fill #1

## 2019-11-24 ENCOUNTER — Ambulatory Visit: Payer: Self-pay | Admitting: Neurology

## 2019-11-24 ENCOUNTER — Telehealth: Payer: Self-pay | Admitting: Neurology

## 2019-11-24 NOTE — Telephone Encounter (Signed)
Pt called stating she will not be able to attend her Botox appt for today due to having to pick up child at daycare. Pt states she would like to r/s. Please advise.

## 2019-12-10 ENCOUNTER — Encounter: Payer: Self-pay | Admitting: *Deleted

## 2019-12-14 ENCOUNTER — Encounter: Payer: Self-pay | Admitting: *Deleted

## 2019-12-14 ENCOUNTER — Ambulatory Visit: Payer: Self-pay | Admitting: Neurology

## 2019-12-15 ENCOUNTER — Other Ambulatory Visit: Payer: Self-pay

## 2019-12-15 ENCOUNTER — Ambulatory Visit (INDEPENDENT_AMBULATORY_CARE_PROVIDER_SITE_OTHER): Payer: No Typology Code available for payment source | Admitting: Neurology

## 2019-12-15 DIAGNOSIS — G43711 Chronic migraine without aura, intractable, with status migrainosus: Secondary | ICD-10-CM | POA: Diagnosis not present

## 2019-12-15 NOTE — Progress Notes (Signed)
Consent Form Botulism Toxin Injection For Chronic Migraine  12/15/2019; Second botox. Baseline is daily headaches with 10 migraine days a month of which moderate or severe in intensity. Botox was great she only had 3 migraines in 3 months and 2 of them were when the botox was wearing off. 5555 and a row of 333 also a few extra units above right brow to avoid tommy lee jones brow. +a.  Reviewed orally with patient, additionally signature is on file: She lloved the Tosymra. Nurtec and ubrelvy eh. Imitrex injection ok. Awesome patient.   Botulism toxin has been approved by the Federal drug administration for treatment of chronic migraine. Botulism toxin does not cure chronic migraine and it may not be effective in some patients.  The administration of botulism toxin is accomplished by injecting a small amount of toxin into the muscles of the neck and head. Dosage must be titrated for each individual. Any benefits resulting from botulism toxin tend to wear off after 3 months with a repeat injection required if benefit is to be maintained. Injections are usually done every 3-4 months with maximum effect peak achieved by about 2 or 3 weeks. Botulism toxin is expensive and you should be sure of what costs you will incur resulting from the injection.  The side effects of botulism toxin use for chronic migraine may include:   -Transient, and usually mild, facial weakness with facial injections  -Transient, and usually mild, head or neck weakness with head/neck injections  -Reduction or loss of forehead facial animation due to forehead muscle weakness  -Eyelid drooping  -Dry eye  -Pain at the site of injection or bruising at the site of injection  -Double vision  -Potential unknown long term risks  Contraindications: You should not have Botox if you are pregnant, nursing, allergic to albumin, have an infection, skin condition, or muscle weakness at the site of the injection, or have myasthenia gravis,  Lambert-Eaton syndrome, or ALS.  It is also possible that as with any injection, there may be an allergic reaction or no effect from the medication. Reduced effectiveness after repeated injections is sometimes seen and rarely infection at the injection site may occur. All care will be taken to prevent these side effects. If therapy is given over a long time, atrophy and wasting in the muscle injected may occur. Occasionally the patient's become refractory to treatment because they develop antibodies to the toxin. In this event, therapy needs to be modified.  I have read the above information and consent to the administration of botulism toxin.    BOTOX PROCEDURE NOTE FOR MIGRAINE HEADACHE    Contraindications and precautions discussed with patient(above). Aseptic procedure was observed and patient tolerated procedure. Procedure performed by Dr. Georgia Dom  The condition has existed for more than 6 months, and pt does not have a diagnosis of ALS, Myasthenia Gravis or Lambert-Eaton Syndrome.  Risks and benefits of injections discussed and pt agrees to proceed with the procedure.  Written consent obtained  These injections are medically necessary. Pt  receives good benefits from these injections. These injections do not cause sedations or hallucinations which the oral therapies may cause.  Description of procedure:  The patient was placed in a sitting position. The standard protocol was used for Botox as follows, with 5 units of Botox injected at each site:   -Procerus muscle, midline injection  -Corrugator muscle, bilateral injection  -Frontalis muscle, bilateral injection, with 2 sites each side, medial injection was performed in the upper  one third of the frontalis muscle, in the region vertical from the medial inferior edge of the superior orbital rim. The lateral injection was again in the upper one third of the forehead vertically above the lateral limbus of the cornea, 1.5 cm lateral  to the medial injection site.  -Temporalis muscle injection, 4 sites, bilaterally. The first injection was 3 cm above the tragus of the ear, second injection site was 1.5 cm to 3 cm up from the first injection site in line with the tragus of the ear. The third injection site was 1.5-3 cm forward between the first 2 injection sites. The fourth injection site was 1.5 cm posterior to the second injection site.   -Occipitalis muscle injection, 3 sites, bilaterally. The first injection was done one half way between the occipital protuberance and the tip of the mastoid process behind the ear. The second injection site was done lateral and superior to the first, 1 fingerbreadth from the first injection. The third injection site was 1 fingerbreadth superiorly and medially from the first injection site.  -Cervical paraspinal muscle injection, 2 sites, bilateral knee first injection site was 1 cm from the midline of the cervical spine, 3 cm inferior to the lower border of the occipital protuberance. The second injection site was 1.5 cm superiorly and laterally to the first injection site.  -Trapezius muscle injection was performed at 3 sites, bilaterally. The first injection site was in the upper trapezius muscle halfway between the inflection point of the neck, and the acromion. The second injection site was one half way between the acromion and the first injection site. The third injection was done between the first injection site and the inflection point of the neck.   Will return for repeat injection in 3 months.   200 units of Botox was used, any Botox not injected was wasted. The patient tolerated the procedure well, there were no complications of the above procedure.

## 2019-12-15 NOTE — Progress Notes (Signed)
Botox- 100 units x 1 vial Lot: [id number] Expiration: 02/2022 NDC: [id number]  Botox-100 units X1 vial [id number] EXP-06/2022 NDC [id number]  Bacteriostatic 0.9% Sodium Chloride- 75mL total Lot:  [id number] Expiration: 02/04/2020 NDC: [id number]  Dx: [id number]  B/B

## 2019-12-24 ENCOUNTER — Ambulatory Visit: Payer: No Typology Code available for payment source | Admitting: Neurology

## 2020-01-20 MED FILL — AZITHROMYCIN 250 MG TABS: 250 | 5 days supply | Qty: 6 | Fill #0

## 2020-01-21 MED FILL — METHYLPREDNISOLONE 4 MG TAB: 4 | 6 days supply | Qty: 21 | Fill #0

## 2020-02-11 MED FILL — FLUoxetine HCL 20 MG CAPS: 20 | 90 days supply | Qty: 90 | Fill #2

## 2020-02-11 MED FILL — JULEBER 0.15-30 MG-MCG TABS: 0.15-30 | 84 days supply | Qty: 84 | Fill #2

## 2020-02-23 ENCOUNTER — Ambulatory Visit: Payer: No Typology Code available for payment source | Admitting: Neurology

## 2020-03-22 ENCOUNTER — Other Ambulatory Visit: Payer: Self-pay

## 2020-03-22 ENCOUNTER — Ambulatory Visit (INDEPENDENT_AMBULATORY_CARE_PROVIDER_SITE_OTHER): Payer: No Typology Code available for payment source | Admitting: Neurology

## 2020-03-22 DIAGNOSIS — G43711 Chronic migraine without aura, intractable, with status migrainosus: Secondary | ICD-10-CM | POA: Diagnosis not present

## 2020-03-22 NOTE — Progress Notes (Signed)
Consent Form Botulism Toxin Injection For Chronic Migraine  03/22/2020; third botox.EXCELLENT response. She has been to [facility] and actually told me about her, she says botox has changed her life. nurtec did not work, she likes tosymra and zembrace acutely but rarely needs it anymore. Baseline is daily headaches with 10 migraine days a month of which moderate or severe in intensity. Botox was great she only had 1 migraines in 3 months bc the botox was wearing off. 5555 and a row of 333 also a few extra units above right brow to avoid tommy lee jones brow. +a.  Reviewed orally with patient, additionally signature is on file: She lloved the Tosymra. Nurtec and ubrelvy eh. Imitrex injection ok. Awesome patient.   Botulism toxin has been approved by the Federal drug administration for treatment of chronic migraine. Botulism toxin does not cure chronic migraine and it may not be effective in some patients.  The administration of botulism toxin is accomplished by injecting a small amount of toxin into the muscles of the neck and head. Dosage must be titrated for each individual. Any benefits resulting from botulism toxin tend to wear off after 3 months with a repeat injection required if benefit is to be maintained. Injections are usually done every 3-4 months with maximum effect peak achieved by about 2 or 3 weeks. Botulism toxin is expensive and you should be sure of what costs you will incur resulting from the injection.  The side effects of botulism toxin use for chronic migraine may include:   -Transient, and usually mild, facial weakness with facial injections  -Transient, and usually mild, head or neck weakness with head/neck injections  -Reduction or loss of forehead facial animation due to forehead muscle weakness  -Eyelid drooping  -Dry eye  -Pain at the site of injection or bruising at the site of injection  -Double vision  -Potential unknown long term risks  Contraindications: You should  not have Botox if you are pregnant, nursing, allergic to albumin, have an infection, skin condition, or muscle weakness at the site of the injection, or have myasthenia gravis, Lambert-Eaton syndrome, or ALS.  It is also possible that as with any injection, there may be an allergic reaction or no effect from the medication. Reduced effectiveness after repeated injections is sometimes seen and rarely infection at the injection site may occur. All care will be taken to prevent these side effects. If therapy is given over a long time, atrophy and wasting in the muscle injected may occur. Occasionally the patient's become refractory to treatment because they develop antibodies to the toxin. In this event, therapy needs to be modified.  I have read the above information and consent to the administration of botulism toxin.    BOTOX PROCEDURE NOTE FOR MIGRAINE HEADACHE    Contraindications and precautions discussed with patient(above). Aseptic procedure was observed and patient tolerated procedure. Procedure performed by Dr. Georgia Dom  The condition has existed for more than 6 months, and pt does not have a diagnosis of ALS, Myasthenia Gravis or Lambert-Eaton Syndrome.  Risks and benefits of injections discussed and pt agrees to proceed with the procedure.  Written consent obtained  These injections are medically necessary. Pt  receives good benefits from these injections. These injections do not cause sedations or hallucinations which the oral therapies may cause.  Description of procedure:  The patient was placed in a sitting position. The standard protocol was used for Botox as follows, with 5 units of Botox injected at  each site:   -Procerus muscle, midline injection  -Corrugator muscle, bilateral injection  -Frontalis muscle, bilateral injection, with 2 sites each side, medial injection was performed in the upper one third of the frontalis muscle, in the region vertical from the medial  inferior edge of the superior orbital rim. The lateral injection was again in the upper one third of the forehead vertically above the lateral limbus of the cornea, 1.5 cm lateral to the medial injection site.  -Temporalis muscle injection, 4 sites, bilaterally. The first injection was 3 cm above the tragus of the ear, second injection site was 1.5 cm to 3 cm up from the first injection site in line with the tragus of the ear. The third injection site was 1.5-3 cm forward between the first 2 injection sites. The fourth injection site was 1.5 cm posterior to the second injection site.   -Occipitalis muscle injection, 3 sites, bilaterally. The first injection was done one half way between the occipital protuberance and the tip of the mastoid process behind the ear. The second injection site was done lateral and superior to the first, 1 fingerbreadth from the first injection. The third injection site was 1 fingerbreadth superiorly and medially from the first injection site.  -Cervical paraspinal muscle injection, 2 sites, bilateral knee first injection site was 1 cm from the midline of the cervical spine, 3 cm inferior to the lower border of the occipital protuberance. The second injection site was 1.5 cm superiorly and laterally to the first injection site.  -Trapezius muscle injection was performed at 3 sites, bilaterally. The first injection site was in the upper trapezius muscle halfway between the inflection point of the neck, and the acromion. The second injection site was one half way between the acromion and the first injection site. The third injection was done between the first injection site and the inflection point of the neck.   Will return for repeat injection in 3 months.   200 units of Botox was used, any Botox not injected was wasted. The patient tolerated the procedure well, there were no complications of the above procedure.

## 2020-03-22 NOTE — Progress Notes (Signed)
Botox- 200 units x 1 vial Lot: [id number] Expiration: 11/2022 NDC: 7741-2878-67  Bacteriostatic 0.9% Sodium Chloride- 21mL total Lot: [id number] Expiration: 05/06/2021 NDC: 7096-2836-62  Dx: [id number] B/B

## 2020-03-30 ENCOUNTER — Telehealth: Payer: BC Managed Care – PPO | Admitting: Nurse Practitioner

## 2020-03-30 DIAGNOSIS — B084 Enteroviral vesicular stomatitis with exanthem: Secondary | ICD-10-CM

## 2020-03-30 MED ORDER — PREDNISONE 20 MG PO TABS: ORAL_TABLET | ORAL | 0 refills | Status: DC

## 2020-03-30 NOTE — Addendum Note (Signed)
Addended by: Chevis Pretty on: 03/30/2020 09:37 AM   Modules accepted: Orders

## 2020-03-30 NOTE — Progress Notes (Signed)
E Visit for Rash  We are sorry that you are not feeling well. Here is how we plan to help!   This looks like hand , foot  And mouth. This is a viral infection that will have to run its course. You may develop a fever and body aches with this. Not much can do other then motrin or tylenol.   HOME CARE:   Take cool showers and avoid direct sunlight.  Apply cool compress or wet dressings.  Take a bath in an oatmeal bath.  Sprinkle content of one Aveeno packet under running faucet with comfortably warm water.  Bathe for 15-20 minutes, 1-2 times daily.  Pat dry with a towel. Do not rub the rash.  Use hydrocortisone cream.  Take an antihistamine like Benadryl for widespread rashes that itch.  The adult dose of Benadryl is 25-50 mg by mouth 4 times daily.  Caution:  This type of medication may cause sleepiness.  Do not drink alcohol, drive, or operate dangerous machinery while taking antihistamines.  Do not take these medications if you have prostate enlargement.  Read package instructions thoroughly on all medications that you take.  GET HELP RIGHT AWAY IF:   Symptoms don't go away after treatment.  Severe itching that persists.  If you rash spreads or swells.  If you rash begins to smell.  If it blisters and opens or develops a yellow-brown crust.  You develop a fever.  You have a sore throat.  You become short of breath.  MAKE SURE YOU:  Understand these instructions. Will watch your condition. Will get help right away if you are not doing well or get worse.  Thank you for choosing an e-visit. Your e-visit answers were reviewed by a board certified advanced clinical practitioner to complete your personal care plan. Depending upon the condition, your plan could have included both over the counter or prescription medications. Please review your pharmacy choice. Be sure that the pharmacy you have chosen is open so that you can pick up your prescription now.  If there is a  problem you may message your provider in [place] to have the prescription routed to another pharmacy. Your safety is important to [place]. If you have drug allergies check your prescription carefully.  For the next 24 hours, you can use MyChart to ask questions about todays visit, request a non-urgent call back, or ask for a work or school excuse from your e-visit provider. You will get an email in the next two days asking about your experience. I hope that your e-visit has been valuable and will speed your recovery.    5-10 minutes spent reviewing and documenting in chart.'

## 2020-05-06 MED FILL — FLUoxetine HCL 20 MG CAPS: 20 | 90 days supply | Qty: 90 | Fill #3

## 2020-05-06 MED FILL — JULEBER 0.15-30 MG-MCG TABS: 0.15-30 | 84 days supply | Qty: 84 | Fill #3

## 2020-06-28 ENCOUNTER — Ambulatory Visit: Payer: No Typology Code available for payment source | Admitting: Neurology

## 2020-06-28 DIAGNOSIS — G43711 Chronic migraine without aura, intractable, with status migrainosus: Secondary | ICD-10-CM

## 2020-06-28 NOTE — Progress Notes (Signed)
Botox- 200 units x 1 vial Lot: [id number] Expiration: 01/2023 NDC: 3374-4514-60  Bacteriostatic 0.9% Sodium Chloride- 83mL total Lot: [id number] Expiration: 09/06/2021 NDC: 2158-7276-18  Dx: [id number] B/B

## 2020-06-28 NOTE — Progress Notes (Signed)
Consent Form Botulism Toxin Injection For Chronic Migraine  06/28/2020; Hilarious lovel patient, still.EXCELLENT response. She has [facility] and actually told me about her, she says botox has changed her life. nurtec did not work, she likes tosymra and zembrace acutely but rarely needs it anymore. Baseline is daily headaches with 10 migraine days a month of which moderate or severe in intensity. Botox was great she had 0 migraines in 3 months. 5555 and a row of 333(spread out around frontalis where headaches start) also a few extra units above right brow to avoid tommy lee jones brow. +a.  Reviewed orally with patient, additionally signature is on file: She lloved the Tosymra. Nurtec and ubrelvy eh. Imitrex injection ok. Awesome patient.   Botulism toxin has been approved by the Federal drug administration for treatment of chronic migraine. Botulism toxin does not cure chronic migraine and it may not be effective in some patients.  The administration of botulism toxin is accomplished by injecting a small amount of toxin into the muscles of the neck and head. Dosage must be titrated for each individual. Any benefits resulting from botulism toxin tend to wear off after 3 months with a repeat injection required if benefit is to be maintained. Injections are usually done every 3-4 months with maximum effect peak achieved by about 2 or 3 weeks. Botulism toxin is expensive and you should be sure of what costs you will incur resulting from the injection.  The side effects of botulism toxin use for chronic migraine may include:   -Transient, and usually mild, facial weakness with facial injections  -Transient, and usually mild, head or neck weakness with head/neck injections  -Reduction or loss of forehead facial animation due to forehead muscle weakness  -Eyelid drooping  -Dry eye  -Pain at the site of injection or bruising at the site of injection  -Double vision  -Potential unknown long term  risks  Contraindications: You should not have Botox if you are pregnant, nursing, allergic to albumin, have an infection, skin condition, or muscle weakness at the site of the injection, or have myasthenia gravis, Lambert-Eaton syndrome, or ALS.  It is also possible that as with any injection, there may be an allergic reaction or no effect from the medication. Reduced effectiveness after repeated injections is sometimes seen and rarely infection at the injection site may occur. All care will be taken to prevent these side effects. If therapy is given over a long time, atrophy and wasting in the muscle injected may occur. Occasionally the patient's become refractory to treatment because they develop antibodies to the toxin. In this event, therapy needs to be modified.  I have read the above information and consent to the administration of botulism toxin.    BOTOX PROCEDURE NOTE FOR MIGRAINE HEADACHE    Contraindications and precautions discussed with patient(above). Aseptic procedure was observed and patient tolerated procedure. Procedure performed by Dr. Georgia Dom  The condition has existed for more than 6 months, and pt does not have a diagnosis of ALS, Myasthenia Gravis or Lambert-Eaton Syndrome.  Risks and benefits of injections discussed and pt agrees to proceed with the procedure.  Written consent obtained  These injections are medically necessary. Pt  receives good benefits from these injections. These injections do not cause sedations or hallucinations which the oral therapies may cause.  Description of procedure:  The patient was placed in a sitting position. The standard protocol was used for Botox as follows, with 5 units of Botox injected  at each site:   -Procerus muscle, midline injection  -Corrugator muscle, bilateral injection  -Frontalis muscle, bilateral injection, with 2 sites each side, medial injection was performed in the upper one third of the frontalis muscle, in  the region vertical from the medial inferior edge of the superior orbital rim. The lateral injection was again in the upper one third of the forehead vertically above the lateral limbus of the cornea, 1.5 cm lateral to the medial injection site.  -Temporalis muscle injection, 4 sites, bilaterally. The first injection was 3 cm above the tragus of the ear, second injection site was 1.5 cm to 3 cm up from the first injection site in line with the tragus of the ear. The third injection site was 1.5-3 cm forward between the first 2 injection sites. The fourth injection site was 1.5 cm posterior to the second injection site.   -Occipitalis muscle injection, 3 sites, bilaterally. The first injection was done one half way between the occipital protuberance and the tip of the mastoid process behind the ear. The second injection site was done lateral and superior to the first, 1 fingerbreadth from the first injection. The third injection site was 1 fingerbreadth superiorly and medially from the first injection site.  -Cervical paraspinal muscle injection, 2 sites, bilateral knee first injection site was 1 cm from the midline of the cervical spine, 3 cm inferior to the lower border of the occipital protuberance. The second injection site was 1.5 cm superiorly and laterally to the first injection site.  -Trapezius muscle injection was performed at 3 sites, bilaterally. The first injection site was in the upper trapezius muscle halfway between the inflection point of the neck, and the acromion. The second injection site was one half way between the acromion and the first injection site. The third injection was done between the first injection site and the inflection point of the neck.   Will return for repeat injection in 3 months.   200 units of Botox was used, any Botox not injected was wasted. The patient tolerated the procedure well, there were no complications of the above procedure.

## 2020-07-25 ENCOUNTER — Other Ambulatory Visit (HOSPITAL_COMMUNITY): Payer: Self-pay | Admitting: Family Medicine

## 2020-07-25 MED FILL — buPROPion HCL ER (SR) 100 M: 100 | 30 days supply | Qty: 30 | Fill #0

## 2020-08-12 MED FILL — FLUoxetine HCL 20 MG CAPS: 20 | 90 days supply | Qty: 90 | Fill #4

## 2020-08-26 MED FILL — buPROPion HCL ER (SR) 100 M: 100 | 30 days supply | Qty: 30 | Fill #1

## 2020-09-13 ENCOUNTER — Telehealth: Payer: Self-pay | Admitting: Neurology

## 2020-09-13 NOTE — Telephone Encounter (Signed)
Patient has a Botox appointment on 2/22. Her PA with [facility] for Botox expired 08/05/20. I called [facility] [phone]) and spoke with Alyse Low to initiate authorization. She states I will need to speak with the [employer] team. Reference [phone]. She transferred me to [facility] to initiate [place] for [phone] and [phone]. She advised me to fax clinical notes to [phone]. I faxed notes after our call ended.

## 2020-09-20 NOTE — Telephone Encounter (Signed)
Received approval from [facility]. PA #[id number] (09/27/20- 08/05/21).

## 2020-09-27 ENCOUNTER — Ambulatory Visit (INDEPENDENT_AMBULATORY_CARE_PROVIDER_SITE_OTHER): Payer: No Typology Code available for payment source | Admitting: Neurology

## 2020-09-27 DIAGNOSIS — G43711 Chronic migraine without aura, intractable, with status migrainosus: Secondary | ICD-10-CM | POA: Diagnosis not present

## 2020-09-27 NOTE — Progress Notes (Signed)
Botox- 200 units x 1 vial Lot: [id number] Expiration: 06/2023 NDC: 0023-3921-02  Bacteriostatic 0.9% Sodium Chloride- 4mL total Lot: [id number] Expiration: 09/06/2021 NDC: 0409-1966-02  Dx: G43.711 B/B  

## 2020-09-27 NOTE — Progress Notes (Signed)
Consent Form Botulism Toxin Injection For Chronic Migraine  09/27/2020; Hilarious lovel patient, still.EXCELLENT response. She has [facility] to [facility] and actually told me about her, she says botox has changed her life. nurtec did not work, she likes tosymra and zembrace acutely but rarely needs it anymore. Baseline is daily headaches with 10 migraine days a month of which moderate or severe in intensity. Botox was great she had 0 migraines in 3 months. 5555 and a row of 333(spread out around frontalis where headaches start) also a few extra units above right brow to avoid tommy lee jones brow. +a.  Reviewed orally with patient, additionally signature is on file: She lloved the Tosymra. Nurtec and ubrelvy eh. Imitrex injection ok. Awesome patient.   Botulism toxin has been approved by the Federal drug administration for treatment of chronic migraine. Botulism toxin does not cure chronic migraine and it may not be effective in some patients.  The administration of botulism toxin is accomplished by injecting a small amount of toxin into the muscles of the neck and head. Dosage must be titrated for each individual. Any benefits resulting from botulism toxin tend to wear off after 3 months with a repeat injection required if benefit is to be maintained. Injections are usually done every 3-4 months with maximum effect peak achieved by about 2 or 3 weeks. Botulism toxin is expensive and you should be sure of what costs you will incur resulting from the injection.  The side effects of botulism toxin use for chronic migraine may include:   -Transient, and usually mild, facial weakness with facial injections  -Transient, and usually mild, head or neck weakness with head/neck injections  -Reduction or loss of forehead facial animation due to forehead muscle weakness  -Eyelid drooping  -Dry eye  -Pain at the site of injection or bruising at the site of injection  -Double vision  -Potential unknown long term  risks  Contraindications: You should not have Botox if you are pregnant, nursing, allergic to albumin, have an infection, skin condition, or muscle weakness at the site of the injection, or have myasthenia gravis, Lambert-Eaton syndrome, or ALS.  It is also possible that as with any injection, there may be an allergic reaction or no effect from the medication. Reduced effectiveness after repeated injections is sometimes seen and rarely infection at the injection site may occur. All care will be taken to prevent these side effects. If therapy is given over a long time, atrophy and wasting in the muscle injected may occur. Occasionally the patient's become refractory to treatment because they develop antibodies to the toxin. In this event, therapy needs to be modified.  I have read the above information and consent to the administration of botulism toxin.    BOTOX PROCEDURE NOTE FOR MIGRAINE HEADACHE    Contraindications and precautions discussed with patient(above). Aseptic procedure was observed and patient tolerated procedure. Procedure performed by Dr. Georgia Dom  The condition has existed for more than 6 months, and pt does not have a diagnosis of ALS, Myasthenia Gravis or Lambert-Eaton Syndrome.  Risks and benefits of injections discussed and pt agrees to proceed with the procedure.  Written consent obtained  These injections are medically necessary. Pt  receives good benefits from these injections. These injections do not cause sedations or hallucinations which the oral therapies may cause.  Description of procedure:  The patient was placed in a sitting position. The standard protocol was used for Botox as follows, with 5 units of Botox injected  at each site:   -Procerus muscle, midline injection  -Corrugator muscle, bilateral injection  -Frontalis muscle, bilateral injection, with 2 sites each side, medial injection was performed in the upper one third of the frontalis muscle, in  the region vertical from the medial inferior edge of the superior orbital rim. The lateral injection was again in the upper one third of the forehead vertically above the lateral limbus of the cornea, 1.5 cm lateral to the medial injection site.  -Temporalis muscle injection, 4 sites, bilaterally. The first injection was 3 cm above the tragus of the ear, second injection site was 1.5 cm to 3 cm up from the first injection site in line with the tragus of the ear. The third injection site was 1.5-3 cm forward between the first 2 injection sites. The fourth injection site was 1.5 cm posterior to the second injection site.   -Occipitalis muscle injection, 3 sites, bilaterally. The first injection was done one half way between the occipital protuberance and the tip of the mastoid process behind the ear. The second injection site was done lateral and superior to the first, 1 fingerbreadth from the first injection. The third injection site was 1 fingerbreadth superiorly and medially from the first injection site.  -Cervical paraspinal muscle injection, 2 sites, bilateral knee first injection site was 1 cm from the midline of the cervical spine, 3 cm inferior to the lower border of the occipital protuberance. The second injection site was 1.5 cm superiorly and laterally to the first injection site.  -Trapezius muscle injection was performed at 3 sites, bilaterally. The first injection site was in the upper trapezius muscle halfway between the inflection point of the neck, and the acromion. The second injection site was one half way between the acromion and the first injection site. The third injection was done between the first injection site and the inflection point of the neck.   Will return for repeat injection in 3 months.   200 units of Botox was used, any Botox not injected was wasted. The patient tolerated the procedure well, there were no complications of the above procedure.

## 2020-11-12 IMAGING — CT CT ABD-PELV W/ CM
1 of 2 series · 13 of 32 positions shown, 18 images · IV contrast (APPLIED)
Comparison: None.

CLINICAL DATA: Right upper quadrant lump for 3 weeks. Abdominal
wall mass.

EXAM:
CT ABDOMEN AND PELVIS WITH CONTRAST
TECHNIQUE: Multidetector CT imaging of the abdomen and pelvis was performed
using the standard protocol following bolus administration of
intravenous contrast.
CONTRAST:  100mL OMNIPAQUE IOHEXOL 300 MG/ML  SOLN

[Series 2: abd/pelvis w/cm · axial · 0.64mm/px · z∈[-435,-30]mm · 13 of 91 slices shown, 18 images]
[im 5/91  soft-tissue]
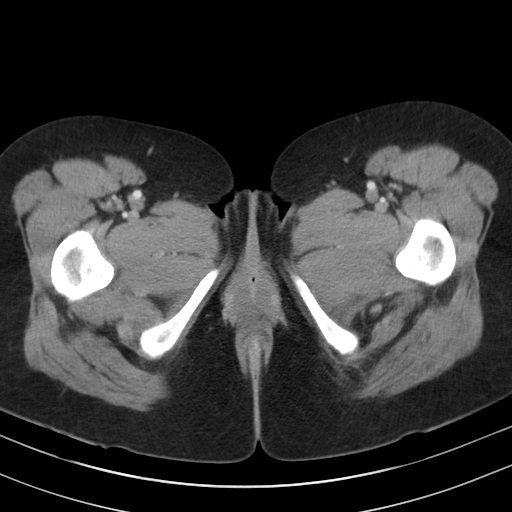
[im 5/91  bone]
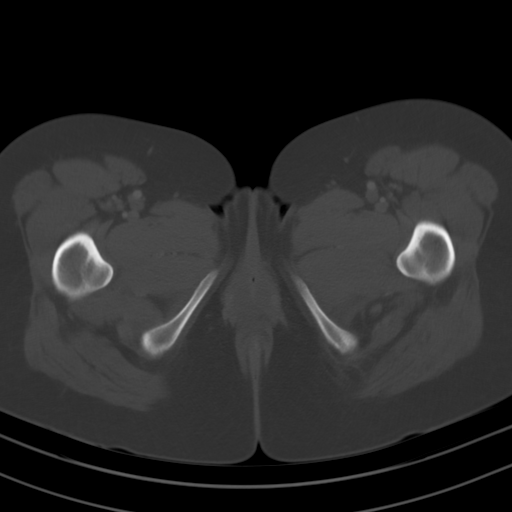
[im 15/91  soft-tissue]
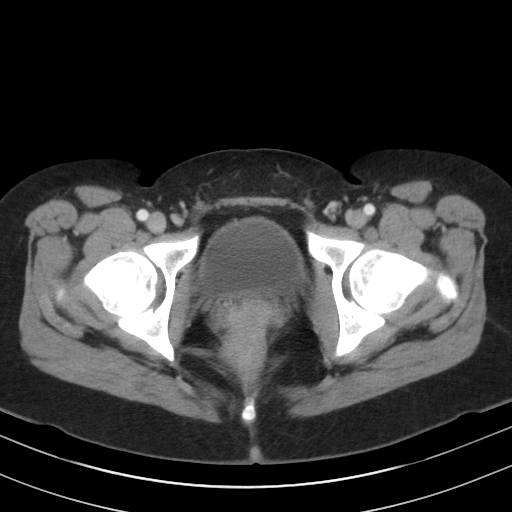
[im 19/91  soft-tissue]
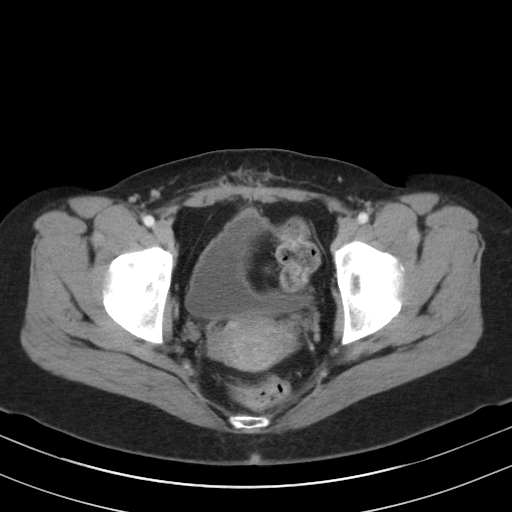
[im 29/91  soft-tissue]
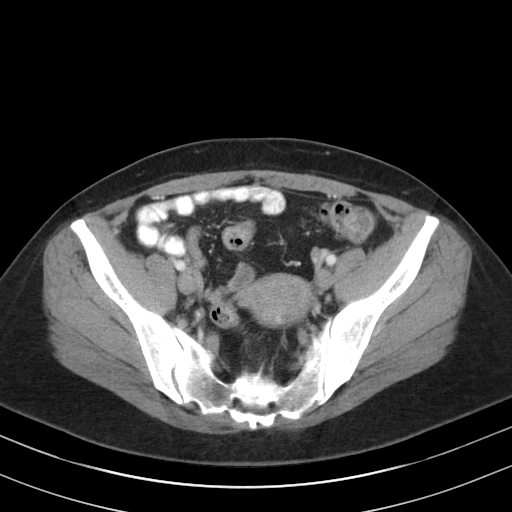
[im 34/91  soft-tissue]
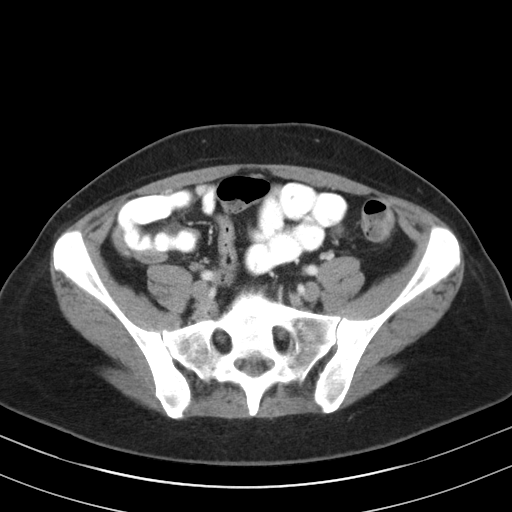
[im 43/91  soft-tissue]
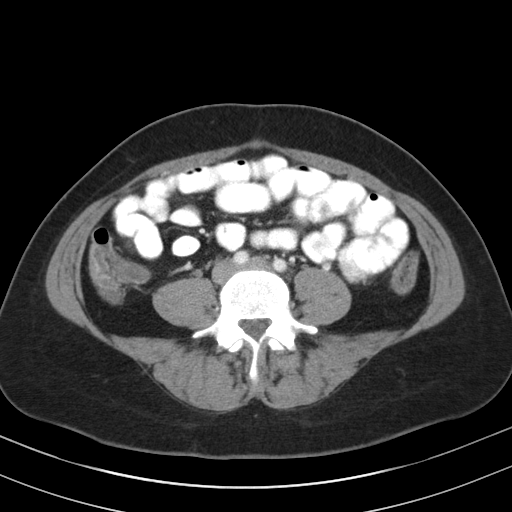
[im 48/91  soft-tissue]
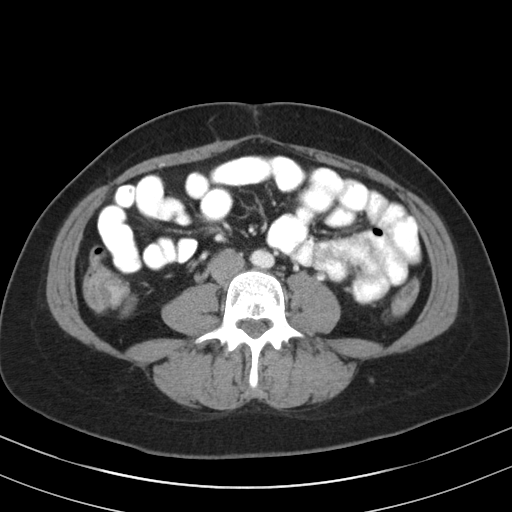
[im 57/91  soft-tissue]
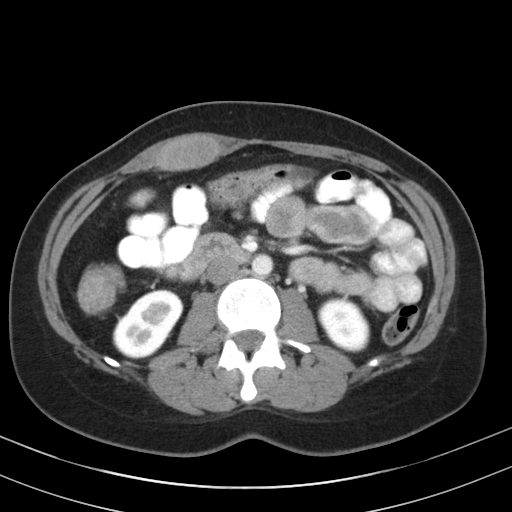
[im 62/91  soft-tissue]
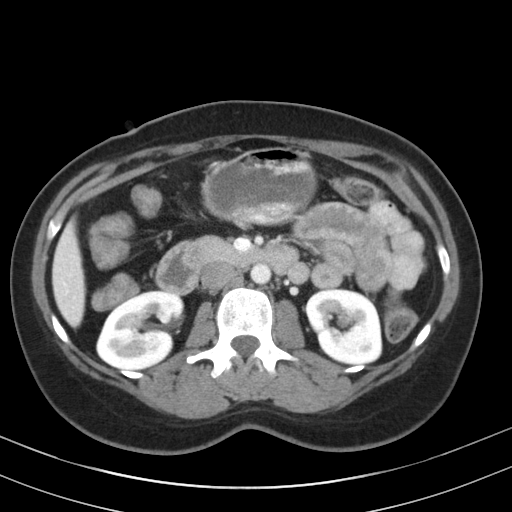
[im 62/91  bone]
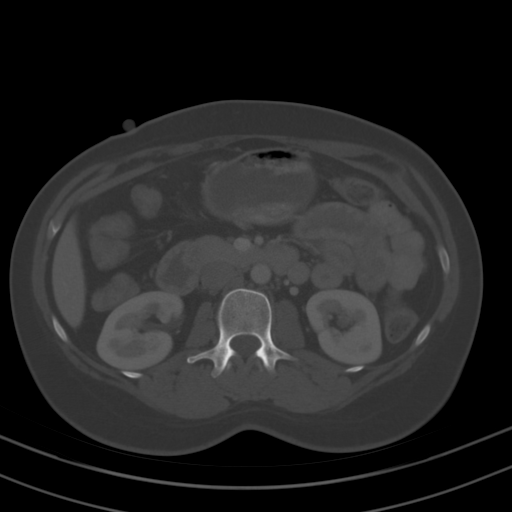
[im 72/91  soft-tissue]
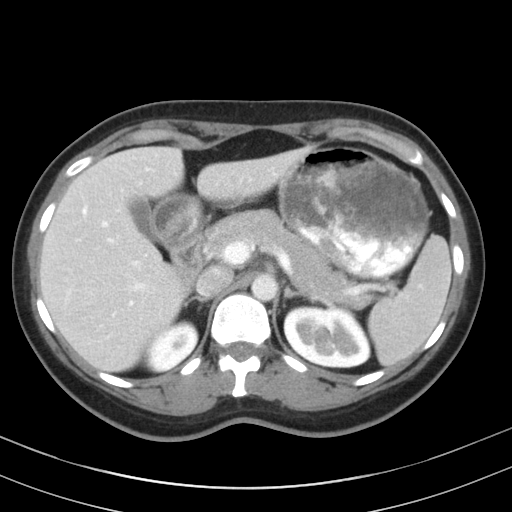
[im 72/91  lung]
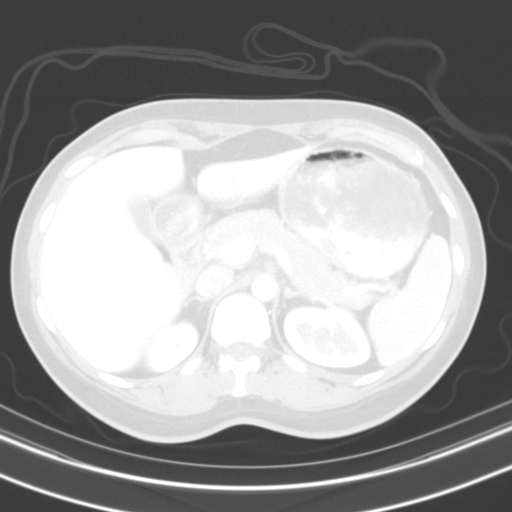
[im 76/91  soft-tissue]
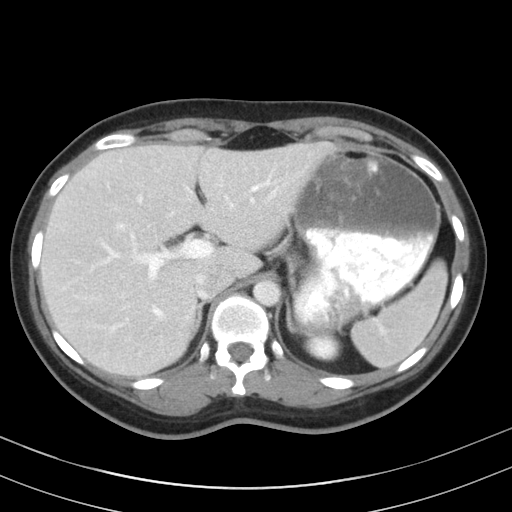
[im 76/91  lung]
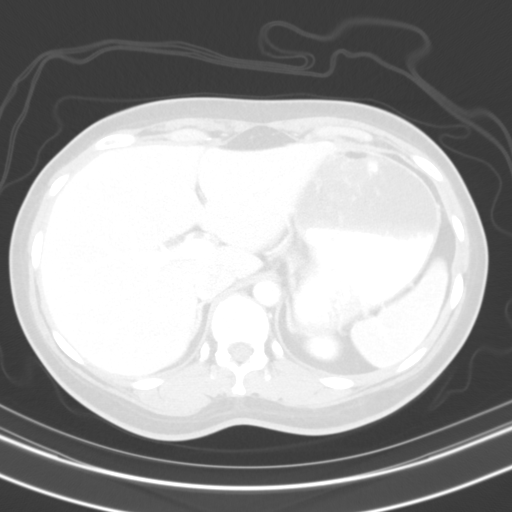
[im 81/91  lung]
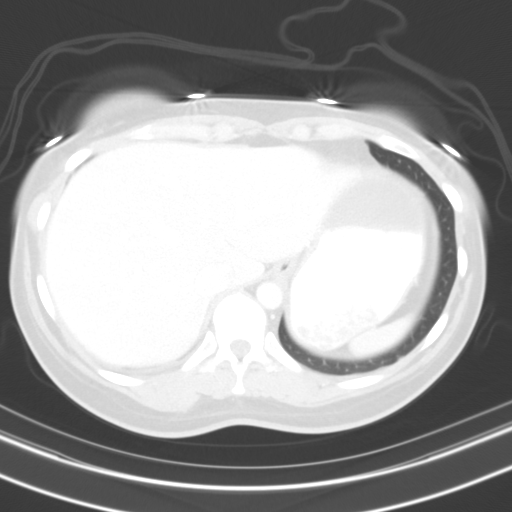
[im 86/91  soft-tissue]
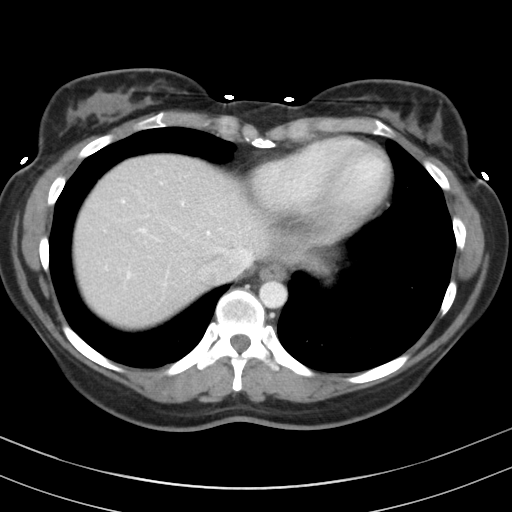
[im 86/91  lung]
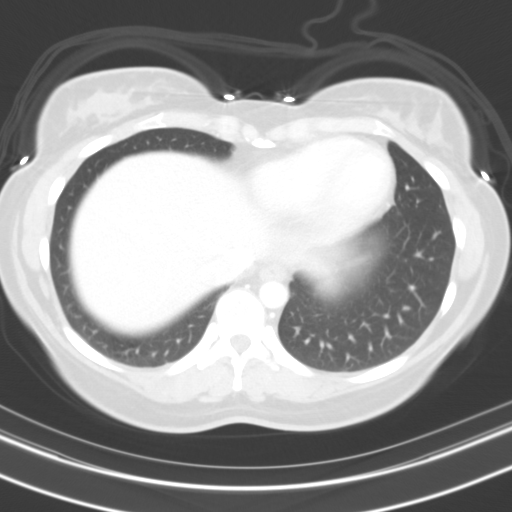

[13 of 32 positions shown; findings below may reference images not displayed]

FINDINGS: Lower chest: 4 mm subpleural right middle lobe nodule ([DATE]) likely a
lymph node.

Hepatobiliary: No focal abnormality within the liver parenchyma.
There is no evidence for gallstones, gallbladder wall thickening, or
pericholecystic fluid. No intrahepatic or extrahepatic biliary
dilation.

Pancreas: No focal mass lesion. No dilatation of the main duct. No
intraparenchymal cyst. No peripancreatic edema.

Spleen: No splenomegaly. No focal mass lesion.

Adrenals/Urinary Tract: No adrenal nodule or mass. Kidneys
unremarkable. No evidence for hydroureter. The urinary bladder
appears normal for the degree of distention.

Stomach/Bowel: Stomach is nondistended. No gastric wall thickening.
No evidence of outlet obstruction. Duodenum is normally positioned
as is the ligament of Treitz. No small bowel wall thickening. No
small bowel dilatation. The terminal ileum is normal. The appendix
is normal. No gross colonic mass. No colonic wall thickening.

Vascular/Lymphatic: No abdominal aortic aneurysm. No abdominal
aortic atherosclerotic calcification. There is no gastrohepatic or
hepatoduodenal ligament lymphadenopathy. No intraperitoneal or
retroperitoneal lymphadenopathy. No pelvic sidewall lymphadenopathy.

Reproductive: Uterus unremarkable.  There is no adnexal mass.

Other: No intraperitoneal free fluid.

Musculoskeletal: 2.3 x 4.4 x 3.2 cm relatively homogeneous
well-defined mass is identified in the right rectus sheath, deep to
the marker placed on the skin to identified the area of patient
concern.
IMPRESSION: 1. 2 x 4 x 3 cm mass in the right rectus sheath. The lesion is
well-defined and homogeneous. Given no pre contrast imaging as part
of this study, it is unclear whether the increased attenuation is
native to the lesion or represents homogeneous enhancement. Although
better defined and more homogeneous than expected for hematoma after
3 weeks, rectus sheath hematoma remains a consideration in this
young individual. Soft tissue neoplasm certainly remains within the
differential consideration. MRI of the abdomen without and with
contrast recommended to assess for enhancement within the lesion.

## 2020-11-13 ENCOUNTER — Other Ambulatory Visit: Payer: Self-pay | Admitting: Neurology

## 2020-11-16 ENCOUNTER — Other Ambulatory Visit (HOSPITAL_COMMUNITY): Payer: Self-pay

## 2020-11-16 MED ORDER — FLUOXETINE HCL 20 MG PO CAPS
20.0000 mg | ORAL_CAPSULE | Freq: Every day | ORAL | 4 refills | Status: DC
  Filled 2020-11-16: qty 90, 90d supply, fill #0
  Filled 2021-02-21: qty 90, 90d supply, fill #1
  Filled 2021-05-25: qty 90, 90d supply, fill #2
  Filled 2021-08-31: qty 90, 90d supply, fill #3

## 2020-12-27 ENCOUNTER — Ambulatory Visit: Payer: No Typology Code available for payment source | Admitting: Neurology

## 2020-12-27 DIAGNOSIS — G43711 Chronic migraine without aura, intractable, with status migrainosus: Secondary | ICD-10-CM | POA: Diagnosis not present

## 2020-12-27 NOTE — Progress Notes (Signed)
Botox- 200 units x 1 vial Lot: [id number] Expiration: 08/2023 NDC: 4461-9012-22  Bacteriostatic 0.9% Sodium Chloride- 86mL total Lot: [id number] Expiration: 01/04/2022 NDC: 3142-7670-11  Dx: Y03.496 B/B

## 2020-12-27 NOTE — Progress Notes (Signed)
Consent Form Botulism Toxin Injection For Chronic Migraine  12/27/2020: stable doing great 09/27/2020; Hilarious lovel patient, still.EXCELLENT response. She [facility] and actually told me about her, she says botox has changed her life. nurtec did not work, she likes tosymra and zembrace acutely but rarely needs it anymore. Baseline is daily headaches with 10 migraine days a month of which moderate or severe in intensity. Botox was great she had 0 migraines in 3 months. 5555 and a row of 333(spread out around frontalis where headaches start) also a few extra units above right brow to avoid tommy lee jones brow. +a.  Reviewed orally with patient, additionally signature is on file: She lloved the Tosymra. Nurtec and ubrelvy eh. Imitrex injection ok. Awesome patient.   Botulism toxin has been approved by the Federal drug administration for treatment of chronic migraine. Botulism toxin does not cure chronic migraine and it may not be effective in some patients.  The administration of botulism toxin is accomplished by injecting a small amount of toxin into the muscles of the neck and head. Dosage must be titrated for each individual. Any benefits resulting from botulism toxin tend to wear off after 3 months with a repeat injection required if benefit is to be maintained. Injections are usually done every 3-4 months with maximum effect peak achieved by about 2 or 3 weeks. Botulism toxin is expensive and you should be sure of what costs you will incur resulting from the injection.  The side effects of botulism toxin use for chronic migraine may include:   -Transient, and usually mild, facial weakness with facial injections  -Transient, and usually mild, head or neck weakness with head/neck injections  -Reduction or loss of forehead facial animation due to forehead muscle weakness  -Eyelid drooping  -Dry eye  -Pain at the site of injection or bruising at the site of injection  -Double  vision  -Potential unknown long term risks  Contraindications: You should not have Botox if you are pregnant, nursing, allergic to albumin, have an infection, skin condition, or muscle weakness at the site of the injection, or have myasthenia gravis, Lambert-Eaton syndrome, or ALS.  It is also possible that as with any injection, there may be an allergic reaction or no effect from the medication. Reduced effectiveness after repeated injections is sometimes seen and rarely infection at the injection site may occur. All care will be taken to prevent these side effects. If therapy is given over a long time, atrophy and wasting in the muscle injected may occur. Occasionally the patient's become refractory to treatment because they develop antibodies to the toxin. In this event, therapy needs to be modified.  I have read the above information and consent to the administration of botulism toxin.    BOTOX PROCEDURE NOTE FOR MIGRAINE HEADACHE    Contraindications and precautions discussed with patient(above). Aseptic procedure was observed and patient tolerated procedure. Procedure performed by Dr. Georgia Dom  The condition has existed for more than 6 months, and pt does not have a diagnosis of ALS, Myasthenia Gravis or Lambert-Eaton Syndrome.  Risks and benefits of injections discussed and pt agrees to proceed with the procedure.  Written consent obtained  These injections are medically necessary. Pt  receives good benefits from these injections. These injections do not cause sedations or hallucinations which the oral therapies may cause.  Description of procedure:  The patient was placed in a sitting position. The standard protocol was used for Botox as follows, with 5  units of Botox injected at each site:   -Procerus muscle, midline injection  -Corrugator muscle, bilateral injection  -Frontalis muscle, bilateral injection, with 2 sites each side, medial injection was performed in the upper  one third of the frontalis muscle, in the region vertical from the medial inferior edge of the superior orbital rim. The lateral injection was again in the upper one third of the forehead vertically above the lateral limbus of the cornea, 1.5 cm lateral to the medial injection site.  -Temporalis muscle injection, 4 sites, bilaterally. The first injection was 3 cm above the tragus of the ear, second injection site was 1.5 cm to 3 cm up from the first injection site in line with the tragus of the ear. The third injection site was 1.5-3 cm forward between the first 2 injection sites. The fourth injection site was 1.5 cm posterior to the second injection site.   -Occipitalis muscle injection, 3 sites, bilaterally. The first injection was done one half way between the occipital protuberance and the tip of the mastoid process behind the ear. The second injection site was done lateral and superior to the first, 1 fingerbreadth from the first injection. The third injection site was 1 fingerbreadth superiorly and medially from the first injection site.  -Cervical paraspinal muscle injection, 2 sites, bilateral knee first injection site was 1 cm from the midline of the cervical spine, 3 cm inferior to the lower border of the occipital protuberance. The second injection site was 1.5 cm superiorly and laterally to the first injection site.  -Trapezius muscle injection was performed at 3 sites, bilaterally. The first injection site was in the upper trapezius muscle halfway between the inflection point of the neck, and the acromion. The second injection site was one half way between the acromion and the first injection site. The third injection was done between the first injection site and the inflection point of the neck.   Will return for repeat injection in 3 months.   200 units of Botox was used, any Botox not injected was wasted. The patient tolerated the procedure well, there were no complications of the above  procedure.

## 2021-01-03 ENCOUNTER — Other Ambulatory Visit (HOSPITAL_COMMUNITY): Payer: Self-pay

## 2021-01-03 MED ORDER — FLUOXETINE HCL 20 MG PO CAPS
20.0000 mg | ORAL_CAPSULE | Freq: Every day | ORAL | 3 refills | Status: DC
  Filled 2021-01-03: qty 90, 90d supply, fill #0

## 2021-02-21 ENCOUNTER — Other Ambulatory Visit (HOSPITAL_COMMUNITY): Payer: Self-pay

## 2021-04-03 ENCOUNTER — Ambulatory Visit (INDEPENDENT_AMBULATORY_CARE_PROVIDER_SITE_OTHER): Payer: No Typology Code available for payment source | Admitting: Neurology

## 2021-04-03 DIAGNOSIS — G43711 Chronic migraine without aura, intractable, with status migrainosus: Secondary | ICD-10-CM | POA: Diagnosis not present

## 2021-04-03 NOTE — Progress Notes (Signed)
Botox- 200 units x 1 vial Lot: [id number] Expiration: 08/2023 NDC: [id number]  Bacteriostatic 0.9% Sodium Chloride- 60m total Lot: [id number]: 08/06/2022 NDC: [id number] Dx: [id number] B/B

## 2021-04-04 ENCOUNTER — Ambulatory Visit: Payer: Self-pay | Admitting: Neurology

## 2021-04-04 NOTE — Progress Notes (Signed)
Consent Form Botulism Toxin Injection For Chronic Migraine  04/03/2021: stable doing fantastic 12/27/2020: stable doing great 09/27/2020; Hilarious lovel patient, still.EXCELLENT response. She has [facility] to [facility] and actually told me about her, she says botox has changed her life. nurtec did not work, she likes tosymra and zembrace acutely but rarely needs it anymore. Baseline is daily headaches with 10 migraine days a month of which moderate or severe in intensity. Botox was great she had 0 migraines in 3 months. Watch out for "tommy lee brow" on right per patient. +a.  Reviewed orally with patient, additionally signature is on file: She lloved the Tosymra. Nurtec and ubrelvy eh. Imitrex injection ok. Awesome patient.   Botulism toxin has been approved by the Federal drug administration for treatment of chronic migraine. Botulism toxin does not cure chronic migraine and it may not be effective in some patients.  The administration of botulism toxin is accomplished by injecting a small amount of toxin into the muscles of the neck and head. Dosage must be titrated for each individual. Any benefits resulting from botulism toxin tend to wear off after 3 months with a repeat injection required if benefit is to be maintained. Injections are usually done every 3-4 months with maximum effect peak achieved by about 2 or 3 weeks. Botulism toxin is expensive and you should be sure of what costs you will incur resulting from the injection.  The side effects of botulism toxin use for chronic migraine may include:   -Transient, and usually mild, facial weakness with facial injections  -Transient, and usually mild, head or neck weakness with head/neck injections  -Reduction or loss of forehead facial animation due to forehead muscle weakness  -Eyelid drooping  -Dry eye  -Pain at the site of injection or bruising at the site of injection  -Double vision  -Potential unknown long term risks  Contraindications:  You should not have Botox if you are pregnant, nursing, allergic to albumin, have an infection, skin condition, or muscle weakness at the site of the injection, or have myasthenia gravis, Lambert-Eaton syndrome, or ALS.  It is also possible that as with any injection, there may be an allergic reaction or no effect from the medication. Reduced effectiveness after repeated injections is sometimes seen and rarely infection at the injection site may occur. All care will be taken to prevent these side effects. If therapy is given over a long time, atrophy and wasting in the muscle injected may occur. Occasionally the patient's become refractory to treatment because they develop antibodies to the toxin. In this event, therapy needs to be modified.  I have read the above information and consent to the administration of botulism toxin.    BOTOX PROCEDURE NOTE FOR MIGRAINE HEADACHE    Contraindications and precautions discussed with patient(above). Aseptic procedure was observed and patient tolerated procedure. Procedure performed by Dr. Georgia Dom  The condition has existed for more than 6 months, and pt does not have a diagnosis of ALS, Myasthenia Gravis or Lambert-Eaton Syndrome.  Risks and benefits of injections discussed and pt agrees to proceed with the procedure.  Written consent obtained  These injections are medically necessary. Pt  receives good benefits from these injections. These injections do not cause sedations or hallucinations which the oral therapies may cause.  Description of procedure:  The patient was placed in a sitting position. The standard protocol was used for Botox as follows, with 5 units of Botox injected at each site:   -Procerus muscle, midline  injection  -Corrugator muscle, bilateral injection  -Frontalis muscle, bilateral injection, with 2 sites each side, medial injection was performed in the upper one third of the frontalis muscle, in the region vertical from the  medial inferior edge of the superior orbital rim. The lateral injection was again in the upper one third of the forehead vertically above the lateral limbus of the cornea, 1.5 cm lateral to the medial injection site.  -Temporalis muscle injection, 4 sites, bilaterally. The first injection was 3 cm above the tragus of the ear, second injection site was 1.5 cm to 3 cm up from the first injection site in line with the tragus of the ear. The third injection site was 1.5-3 cm forward between the first 2 injection sites. The fourth injection site was 1.5 cm posterior to the second injection site.   -Occipitalis muscle injection, 3 sites, bilaterally. The first injection was done one half way between the occipital protuberance and the tip of the mastoid process behind the ear. The second injection site was done lateral and superior to the first, 1 fingerbreadth from the first injection. The third injection site was 1 fingerbreadth superiorly and medially from the first injection site.  -Cervical paraspinal muscle injection, 2 sites, bilateral knee first injection site was 1 cm from the midline of the cervical spine, 3 cm inferior to the lower border of the occipital protuberance. The second injection site was 1.5 cm superiorly and laterally to the first injection site.  -Trapezius muscle injection was performed at 3 sites, bilaterally. The first injection site was in the upper trapezius muscle halfway between the inflection point of the neck, and the acromion. The second injection site was one half way between the acromion and the first injection site. The third injection was done between the first injection site and the inflection point of the neck.   Will return for repeat injection in 3 months.   155 units of Botox was used, 45 Botox not injected was wasted. The patient tolerated the procedure well, there were no complications of the above procedure.

## 2021-05-25 ENCOUNTER — Other Ambulatory Visit (HOSPITAL_COMMUNITY): Payer: Self-pay

## 2021-07-04 ENCOUNTER — Ambulatory Visit (INDEPENDENT_AMBULATORY_CARE_PROVIDER_SITE_OTHER): Payer: No Typology Code available for payment source | Admitting: Neurology

## 2021-07-04 DIAGNOSIS — G43711 Chronic migraine without aura, intractable, with status migrainosus: Secondary | ICD-10-CM | POA: Diagnosis not present

## 2021-07-04 MED ORDER — AJOVY 225 MG/1.5ML ~~LOC~~ SOAJ: 225.0000 mg | SUBCUTANEOUS | 11 refills | Status: AC

## 2021-07-04 NOTE — Progress Notes (Signed)
Botox- 200 units x 1 vial Lot: [id number] Expiration: 12/2022 NDC: 4458-4835-07  Bacteriostatic 0.9% Sodium Chloride- 34mL total Lot: [id number] Expiration: 08/06/2022 NDC: 6720-9198-02  Dx: C17.981  B/B

## 2021-07-04 NOTE — Progress Notes (Addendum)
Consent Form Botulism Toxin Injection For Chronic Migraine  07/04/2021: Still doing great. 60% improvement. Will see if insurance will approve Ajovy; Patient has copay card; she can have medication regardless of insurance approval or copay amount. She also had a reaction to Emgality and can't take aimovig, so I am sure a PA will be approved.  04/03/2021: stable doing fantastic 12/27/2020: stable doing great 09/27/2020; Hilarious lovely patient, still.EXCELLENT response. She has [facility] and actually told me about her, she says botox has changed her life. nurtec did not work, she likes tosymra and zembrace acutely but rarely needs it anymore. Baseline is daily headaches with 10 migraine days a month of which moderate or severe in intensity. Botox was great she had 0 migraines in 3 months. Watch out for "tommy lee brow" on right per patient. +a.  Reviewed orally with patient, additionally signature is on file: She lloved the Tosymra. Nurtec and ubrelvy eh. Imitrex injection ok. Awesome patient.   Botulism toxin has been approved by the Federal drug administration for treatment of chronic migraine. Botulism toxin does not cure chronic migraine and it may not be effective in some patients.  The administration of botulism toxin is accomplished by injecting a small amount of toxin into the muscles of the neck and head. Dosage must be titrated for each individual. Any benefits resulting from botulism toxin tend to wear off after 3 months with a repeat injection required if benefit is to be maintained. Injections are usually done every 3-4 months with maximum effect peak achieved by about 2 or 3 weeks. Botulism toxin is expensive and you should be sure of what costs you will incur resulting from the injection.  The side effects of botulism toxin use for chronic migraine may include:   -Transient, and usually mild, facial weakness with facial injections  -Transient, and usually mild, head or neck  weakness with head/neck injections  -Reduction or loss of forehead facial animation due to forehead muscle weakness  -Eyelid drooping  -Dry eye  -Pain at the site of injection or bruising at the site of injection  -Double vision  -Potential unknown long term risks  Contraindications: You should not have Botox if you are pregnant, nursing, allergic to albumin, have an infection, skin condition, or muscle weakness at the site of the injection, or have myasthenia gravis, Lambert-Eaton syndrome, or ALS.  It is also possible that as with any injection, there may be an allergic reaction or no effect from the medication. Reduced effectiveness after repeated injections is sometimes seen and rarely infection at the injection site may occur. All care will be taken to prevent these side effects. If therapy is given over a long time, atrophy and wasting in the muscle injected may occur. Occasionally the patient's become refractory to treatment because they develop antibodies to the toxin. In this event, therapy needs to be modified.  I have read the above information and consent to the administration of botulism toxin.    BOTOX PROCEDURE NOTE FOR MIGRAINE HEADACHE    Contraindications and precautions discussed with patient(above). Aseptic procedure was observed and patient tolerated procedure. Procedure performed by Dr. Georgia Dom  The condition has existed for more than 6 months, and pt does not have a diagnosis of ALS, Myasthenia Gravis or Lambert-Eaton Syndrome.  Risks and benefits of injections discussed and pt agrees to proceed with the procedure.  Written consent obtained  These injections are medically necessary. Pt  receives good benefits from these injections. These  injections do not cause sedations or hallucinations which the oral therapies may cause.  Description of procedure:  The patient was placed in a sitting position. The standard protocol was used for Botox as follows, with 5 units  of Botox injected at each site:   -Procerus muscle, midline injection  -Corrugator muscle, bilateral injection  -Frontalis muscle, bilateral injection, with 2 sites each side, medial injection was performed in the upper one third of the frontalis muscle, in the region vertical from the medial inferior edge of the superior orbital rim. The lateral injection was again in the upper one third of the forehead vertically above the lateral limbus of the cornea, 1.5 cm lateral to the medial injection site.  -Temporalis muscle injection, 4 sites, bilaterally. The first injection was 3 cm above the tragus of the ear, second injection site was 1.5 cm to 3 cm up from the first injection site in line with the tragus of the ear. The third injection site was 1.5-3 cm forward between the first 2 injection sites. The fourth injection site was 1.5 cm posterior to the second injection site.   -Occipitalis muscle injection, 3 sites, bilaterally. The first injection was done one half way between the occipital protuberance and the tip of the mastoid process behind the ear. The second injection site was done lateral and superior to the first, 1 fingerbreadth from the first injection. The third injection site was 1 fingerbreadth superiorly and medially from the first injection site.  -Cervical paraspinal muscle injection, 2 sites, bilateral knee first injection site was 1 cm from the midline of the cervical spine, 3 cm inferior to the lower border of the occipital protuberance. The second injection site was 1.5 cm superiorly and laterally to the first injection site.  -Trapezius muscle injection was performed at 3 sites, bilaterally. The first injection site was in the upper trapezius muscle halfway between the inflection point of the neck, and the acromion. The second injection site was one half way between the acromion and the first injection site. The third injection was done between the first injection site and the  inflection point of the neck.   Will return for repeat injection in 3 months.   155 units of Botox was used, 45 Botox not injected was wasted. The patient tolerated the procedure well, there were no complications of the above procedure.

## 2021-08-22 ENCOUNTER — Telehealth: Payer: Self-pay | Admitting: Neurology

## 2021-08-22 NOTE — Telephone Encounter (Signed)
Patient's next Botox appointment is 2/28. Patient's PA for Botox w/ [facility] expired 08/05/21. I called [phone] & spoke with Jason Fila to start PA for [id number]. Dx: L73.081. Jason Fila states clinicals need to be faxed in. Notes were faxed for review once call ended.

## 2021-08-29 NOTE — Telephone Encounter (Signed)
Received approval from [facility]. PA #[id number] (10/03/21-10/03/22).

## 2021-08-31 ENCOUNTER — Other Ambulatory Visit (HOSPITAL_COMMUNITY): Payer: Self-pay

## 2021-10-03 ENCOUNTER — Ambulatory Visit (INDEPENDENT_AMBULATORY_CARE_PROVIDER_SITE_OTHER): Payer: No Typology Code available for payment source | Admitting: Neurology

## 2021-10-03 DIAGNOSIS — G43711 Chronic migraine without aura, intractable, with status migrainosus: Secondary | ICD-10-CM

## 2021-10-03 NOTE — Progress Notes (Signed)
Botox- 200 units x 1 vial Lot: [id number] Expiration: 11/2023 NDC: 7510-2585-27  Bacteriostatic 0.9% Sodium Chloride- 56mL total Lot: [id number] Expiration: 03/07/2023 NDC: 7824-2353-61  Dx: W43.154 B/B

## 2021-10-03 NOTE — Progress Notes (Addendum)
Consent Form Botulism Toxin Injection For Chronic Migraine  10/03/2021: stable, doing great.   07/04/2021: Still doing great. 60% improvement. Will see if insurance will approve Ajovy; Patient has copay card; she can have medication regardless of insurance approval or copay amount. She also had a reaction to Emgality and can't take aimovig, so I am sure a PA will be approved.  04/03/2021: stable doing fantastic 12/27/2020: stable doing great 09/27/2020; Hilarious lovely patient, still.EXCELLENT response. She [facility] to [facility] and actually told me about her, she says botox has changed her life. nurtec did not work, she likes tosymra and zembrace acutely but rarely needs it anymore. Baseline is daily headaches with 10 migraine days a month of which moderate or severe in intensity. Botox was great she had 0 migraines in 3 months. Watch out for "tommy lee brow" on right per patient. +a.  Reviewed orally with patient, additionally signature is on file: She lloved the Tosymra. Nurtec and ubrelvy eh. Imitrex injection ok. Awesome patient.   Botulism toxin has been approved by the Federal drug administration for treatment of chronic migraine. Botulism toxin does not cure chronic migraine and it may not be effective in some patients.  The administration of botulism toxin is accomplished by injecting a small amount of toxin into the muscles of the neck and head. Dosage must be titrated for each individual. Any benefits resulting from botulism toxin tend to wear off after 3 months with a repeat injection required if benefit is to be maintained. Injections are usually done every 3-4 months with maximum effect peak achieved by about 2 or 3 weeks. Botulism toxin is expensive and you should be sure of what costs you will incur resulting from the injection.  The side effects of botulism toxin use for chronic migraine may include:   -Transient, and usually mild, facial weakness with facial injections  -Transient,  and usually mild, head or neck weakness with head/neck injections  -Reduction or loss of forehead facial animation due to forehead muscle weakness  -Eyelid drooping  -Dry eye  -Pain at the site of injection or bruising at the site of injection  -Double vision  -Potential unknown long term risks  Contraindications: You should not have Botox if you are pregnant, nursing, allergic to albumin, have an infection, skin condition, or muscle weakness at the site of the injection, or have myasthenia gravis, Lambert-Eaton syndrome, or ALS.  It is also possible that as with any injection, there may be an allergic reaction or no effect from the medication. Reduced effectiveness after repeated injections is sometimes seen and rarely infection at the injection site may occur. All care will be taken to prevent these side effects. If therapy is given over a long time, atrophy and wasting in the muscle injected may occur. Occasionally the patient's become refractory to treatment because they develop antibodies to the toxin. In this event, therapy needs to be modified.  I have read the above information and consent to the administration of botulism toxin.    BOTOX PROCEDURE NOTE FOR MIGRAINE HEADACHE    Contraindications and precautions discussed with patient(above). Aseptic procedure was observed and patient tolerated procedure. Procedure performed by Dr. Georgia Dom  The condition has existed for more than 6 months, and pt does not have a diagnosis of ALS, Myasthenia Gravis or Lambert-Eaton Syndrome.  Risks and benefits of injections discussed and pt agrees to proceed with the procedure.  Written consent obtained  These injections are medically necessary. Pt  receives  good benefits from these injections. These injections do not cause sedations or hallucinations which the oral therapies may cause.  Description of procedure:  The patient was placed in a sitting position. The standard protocol was used for  Botox as follows, with 5 units of Botox injected at each site:   -Procerus muscle, midline injection  -Corrugator muscle, bilateral injection  -Frontalis muscle, bilateral injection, with 2 sites each side, medial injection was performed in the upper one third of the frontalis muscle, in the region vertical from the medial inferior edge of the superior orbital rim. The lateral injection was again in the upper one third of the forehead vertically above the lateral limbus of the cornea, 1.5 cm lateral to the medial injection site.  -Temporalis muscle injection, 4 sites, bilaterally. The first injection was 3 cm above the tragus of the ear, second injection site was 1.5 cm to 3 cm up from the first injection site in line with the tragus of the ear. The third injection site was 1.5-3 cm forward between the first 2 injection sites. The fourth injection site was 1.5 cm posterior to the second injection site.   -Occipitalis muscle injection, 3 sites, bilaterally. The first injection was done one half way between the occipital protuberance and the tip of the mastoid process behind the ear. The second injection site was done lateral and superior to the first, 1 fingerbreadth from the first injection. The third injection site was 1 fingerbreadth superiorly and medially from the first injection site.  -Cervical paraspinal muscle injection, 2 sites, bilateral knee first injection site was 1 cm from the midline of the cervical spine, 3 cm inferior to the lower border of the occipital protuberance. The second injection site was 1.5 cm superiorly and laterally to the first injection site.  -Trapezius muscle injection was performed at 3 sites, bilaterally. The first injection site was in the upper trapezius muscle halfway between the inflection point of the neck, and the acromion. The second injection site was one half way between the acromion and the first injection site. The third injection was done between the  first injection site and the inflection point of the neck.   Will return for repeat injection in 3 months.   155 units of Botox was used, 45 Botox not injected was wasted. The patient tolerated the procedure well, there were no complications of the above procedure.

## 2021-12-08 ENCOUNTER — Other Ambulatory Visit: Payer: Self-pay | Admitting: Neurology

## 2021-12-11 ENCOUNTER — Other Ambulatory Visit (HOSPITAL_COMMUNITY): Payer: Self-pay

## 2021-12-12 ENCOUNTER — Other Ambulatory Visit (HOSPITAL_COMMUNITY): Payer: Self-pay

## 2021-12-12 MED ORDER — FLUOXETINE HCL 20 MG PO CAPS
20.0000 mg | ORAL_CAPSULE | Freq: Every day | ORAL | 4 refills | Status: DC
  Filled 2021-12-12: qty 90, 90d supply, fill #0
  Filled 2022-03-15: qty 90, 90d supply, fill #1

## 2021-12-26 ENCOUNTER — Ambulatory Visit (INDEPENDENT_AMBULATORY_CARE_PROVIDER_SITE_OTHER): Payer: No Typology Code available for payment source | Admitting: Neurology

## 2021-12-26 DIAGNOSIS — G43711 Chronic migraine without aura, intractable, with status migrainosus: Secondary | ICD-10-CM | POA: Diagnosis not present

## 2021-12-26 NOTE — Progress Notes (Unsigned)
Botox consent signed  Botox- 200 units x 1 vial Lot: [id number] Expiration: 07/2024 NDC: 5409-8119-14  Bacteriostatic 0.9% Sodium Chloride- 78m total Lot: [id number]:  NDC: [address]-84 Dx: GO96.295B/B

## 2021-12-26 NOTE — Progress Notes (Signed)
Consent Form Botulism Toxin Injection For Chronic Migraine   10/03/2021: stable, doing great.   07/04/2021: Still doing great. 60% improvement. Will see if insurance will approve Ajovy; Patient has copay card; she can have medication regardless of insurance approval or copay amount. She also had a reaction to Emgality and can't take aimovig, so I am sure a PA will be approved.  04/03/2021: stable doing fantastic 12/27/2020: stable doing great 09/27/2020; Hilarious lovely patient, still.EXCELLENT response. She has been to [facility] and actually told me about her, she says botox has changed her life. nurtec did not work, she likes tosymra and zembrace acutely but rarely needs it anymore. Baseline is daily headaches with 10 migraine days a month of which moderate or severe in intensity. Botox was great she had 0 migraines in 3 months. Watch out for "tommy lee brow" on right per patient. +a.  Reviewed orally with patient, additionally signature is on file: She lloved the Tosymra. Nurtec and ubrelvy eh. Imitrex injection ok. Awesome patient.   Botulism toxin has been approved by the Federal drug administration for treatment of chronic migraine. Botulism toxin does not cure chronic migraine and it may not be effective in some patients.  The administration of botulism toxin is accomplished by injecting a small amount of toxin into the muscles of the neck and head. Dosage must be titrated for each individual. Any benefits resulting from botulism toxin tend to wear off after 3 months with a repeat injection required if benefit is to be maintained. Injections are usually done every 3-4 months with maximum effect peak achieved by about 2 or 3 weeks. Botulism toxin is expensive and you should be sure of what costs you will incur resulting from the injection.  The side effects of botulism toxin use for chronic migraine may include:   -Transient, and usually mild, facial weakness with facial injections  -Transient,  and usually mild, head or neck weakness with head/neck injections  -Reduction or loss of forehead facial animation due to forehead muscle weakness  -Eyelid drooping  -Dry eye  -Pain at the site of injection or bruising at the site of injection  -Double vision  -Potential unknown long term risks  Contraindications: You should not have Botox if you are pregnant, nursing, allergic to albumin, have an infection, skin condition, or muscle weakness at the site of the injection, or have myasthenia gravis, Lambert-Eaton syndrome, or ALS.  It is also possible that as with any injection, there may be an allergic reaction or no effect from the medication. Reduced effectiveness after repeated injections is sometimes seen and rarely infection at the injection site may occur. All care will be taken to prevent these side effects. If therapy is given over a long time, atrophy and wasting in the muscle injected may occur. Occasionally the patient's become refractory to treatment because they develop antibodies to the toxin. In this event, therapy needs to be modified.  I have read the above information and consent to the administration of botulism toxin.    BOTOX PROCEDURE NOTE FOR MIGRAINE HEADACHE    Contraindications and precautions discussed with patient(above). Aseptic procedure was observed and patient tolerated procedure. Procedure performed by Dr. Georgia Dom  The condition has existed for more than 6 months, and pt does not have a diagnosis of ALS, Myasthenia Gravis or Lambert-Eaton Syndrome.  Risks and benefits of injections discussed and pt agrees to proceed with the procedure.  Written consent obtained  These injections are medically necessary. Pt  receives good benefits from these injections. These injections do not cause sedations or hallucinations which the oral therapies may cause.  Description of procedure:  The patient was placed in a sitting position. The standard protocol was used for  Botox as follows, with 5 units of Botox injected at each site:   -Procerus muscle, midline injection  -Corrugator muscle, bilateral injection  -Frontalis muscle, bilateral injection, with 2 sites each side, medial injection was performed in the upper one third of the frontalis muscle, in the region vertical from the medial inferior edge of the superior orbital rim. The lateral injection was again in the upper one third of the forehead vertically above the lateral limbus of the cornea, 1.5 cm lateral to the medial injection site.  -Temporalis muscle injection, 4 sites, bilaterally. The first injection was 3 cm above the tragus of the ear, second injection site was 1.5 cm to 3 cm up from the first injection site in line with the tragus of the ear. The third injection site was 1.5-3 cm forward between the first 2 injection sites. The fourth injection site was 1.5 cm posterior to the second injection site.   -Occipitalis muscle injection, 3 sites, bilaterally. The first injection was done one half way between the occipital protuberance and the tip of the mastoid process behind the ear. The second injection site was done lateral and superior to the first, 1 fingerbreadth from the first injection. The third injection site was 1 fingerbreadth superiorly and medially from the first injection site.  -Cervical paraspinal muscle injection, 2 sites, bilateral knee first injection site was 1 cm from the midline of the cervical spine, 3 cm inferior to the lower border of the occipital protuberance. The second injection site was 1.5 cm superiorly and laterally to the first injection site.  -Trapezius muscle injection was performed at 3 sites, bilaterally. The first injection site was in the upper trapezius muscle halfway between the inflection point of the neck, and the acromion. The second injection site was one half way between the acromion and the first injection site. The third injection was done between the  first injection site and the inflection point of the neck.   Will return for repeat injection in 3 months.   155 units of Botox was used, 45 Botox not injected was wasted. The patient tolerated the procedure well, there were no complications of the above procedure.

## 2022-01-17 ENCOUNTER — Other Ambulatory Visit (HOSPITAL_COMMUNITY): Payer: Self-pay

## 2022-01-17 MED ORDER — FLUOXETINE HCL 20 MG PO CAPS
20.0000 mg | ORAL_CAPSULE | Freq: Every day | ORAL | 3 refills | Status: DC
  Filled 2022-01-17 – 2022-06-20 (×3): qty 90, 90d supply, fill #0
  Filled 2022-09-11: qty 90, 90d supply, fill #1
  Filled 2022-12-11: qty 90, 90d supply, fill #2

## 2022-03-15 ENCOUNTER — Other Ambulatory Visit (HOSPITAL_COMMUNITY): Payer: Self-pay

## 2022-03-27 ENCOUNTER — Ambulatory Visit (INDEPENDENT_AMBULATORY_CARE_PROVIDER_SITE_OTHER): Payer: No Typology Code available for payment source | Admitting: Neurology

## 2022-03-27 DIAGNOSIS — G43711 Chronic migraine without aura, intractable, with status migrainosus: Secondary | ICD-10-CM | POA: Diagnosis not present

## 2022-03-27 MED ORDER — ONABOTULINUMTOXINA 200 UNITS IJ SOLR
155.0000 [IU] | Freq: Once | INTRAMUSCULAR | Status: AC
  Administered 2022-03-27: 155 [IU] via INTRAMUSCULAR

## 2022-03-27 NOTE — Progress Notes (Signed)
Botox- 200 units x 1 vial Lot: [id number] Expiration: 09/2024 NDC: 5681-2751-70  Bacteriostatic 0.9% Sodium Chloride- 46m total Lot: [id number] Expiration: 03/07/2023 NDC: [address]-67 Dx: [id number]/B

## 2022-03-27 NOTE — Progress Notes (Signed)
Consent Form Botulism Toxin Injection For Chronic Migraine     03/27/2022: stable, Still doing great. >>70% improvement in migraine freq and severity. +a. 12/26/2021: stable, doing great.  10/03/2021: stable, doing great.  07/04/2021: Still doing great. 60% improvement. Will see if insurance will approve Ajovy; Patient has copay card; she can have medication regardless of insurance approval or copay amount. She also had a reaction to Emgality and can't take aimovig, so I am sure a PA will be approved.  04/03/2021: stable doing fantastic 12/27/2020: stable doing great 09/27/2020; Hilarious lovely patient, still.EXCELLENT response. She has been to [facility] and actually told me about her, she says botox has changed her life. nurtec did not work, she likes tosymra and zembrace acutely but rarely needs it anymore. Baseline is daily headaches with 10 migraine days a month of which moderate or severe in intensity. Botox was great she had 0 migraines in 3 months. Watch out for "tommy lee brow" on right per patient. +a.  Reviewed orally with patient, additionally signature is on file: She lloved the Tosymra. Nurtec and ubrelvy eh. Imitrex injection ok. Awesome patient.   Botulism toxin has been approved by the Federal drug administration for treatment of chronic migraine. Botulism toxin does not cure chronic migraine and it may not be effective in some patients.  The administration of botulism toxin is accomplished by injecting a small amount of toxin into the muscles of the neck and head. Dosage must be titrated for each individual. Any benefits resulting from botulism toxin tend to wear off after 3 months with a repeat injection required if benefit is to be maintained. Injections are usually done every 3-4 months with maximum effect peak achieved by about 2 or 3 weeks. Botulism toxin is expensive and you should be sure of what costs you will incur resulting from the injection.  The side effects of botulism  toxin use for chronic migraine may include:   -Transient, and usually mild, facial weakness with facial injections  -Transient, and usually mild, head or neck weakness with head/neck injections  -Reduction or loss of forehead facial animation due to forehead muscle weakness  -Eyelid drooping  -Dry eye  -Pain at the site of injection or bruising at the site of injection  -Double vision  -Potential unknown long term risks  Contraindications: You should not have Botox if you are pregnant, nursing, allergic to albumin, have an infection, skin condition, or muscle weakness at the site of the injection, or have myasthenia gravis, Lambert-Eaton syndrome, or ALS.  It is also possible that as with any injection, there may be an allergic reaction or no effect from the medication. Reduced effectiveness after repeated injections is sometimes seen and rarely infection at the injection site may occur. All care will be taken to prevent these side effects. If therapy is given over a long time, atrophy and wasting in the muscle injected may occur. Occasionally the patient's become refractory to treatment because they develop antibodies to the toxin. In this event, therapy needs to be modified.  I have read the above information and consent to the administration of botulism toxin.    BOTOX PROCEDURE NOTE FOR MIGRAINE HEADACHE    Contraindications and precautions discussed with patient(above). Aseptic procedure was observed and patient tolerated procedure. Procedure performed by Dr. Georgia Dom  The condition has existed for more than 6 months, and pt does not have a diagnosis of ALS, Myasthenia Gravis or Lambert-Eaton Syndrome.  Risks and benefits of injections discussed and  pt agrees to proceed with the procedure.  Written consent obtained  These injections are medically necessary. Pt  receives good benefits from these injections. These injections do not cause sedations or hallucinations which the oral  therapies may cause.  Description of procedure:  The patient was placed in a sitting position. The standard protocol was used for Botox as follows, with 5 units of Botox injected at each site:   -Procerus muscle, midline injection  -Corrugator muscle, bilateral injection  -Frontalis muscle, bilateral injection, with 2 sites each side, medial injection was performed in the upper one third of the frontalis muscle, in the region vertical from the medial inferior edge of the superior orbital rim. The lateral injection was again in the upper one third of the forehead vertically above the lateral limbus of the cornea, 1.5 cm lateral to the medial injection site.  -Temporalis muscle injection, 4 sites, bilaterally. The first injection was 3 cm above the tragus of the ear, second injection site was 1.5 cm to 3 cm up from the first injection site in line with the tragus of the ear. The third injection site was 1.5-3 cm forward between the first 2 injection sites. The fourth injection site was 1.5 cm posterior to the second injection site.   -Occipitalis muscle injection, 3 sites, bilaterally. The first injection was done one half way between the occipital protuberance and the tip of the mastoid process behind the ear. The second injection site was done lateral and superior to the first, 1 fingerbreadth from the first injection. The third injection site was 1 fingerbreadth superiorly and medially from the first injection site.  -Cervical paraspinal muscle injection, 2 sites, bilateral knee first injection site was 1 cm from the midline of the cervical spine, 3 cm inferior to the lower border of the occipital protuberance. The second injection site was 1.5 cm superiorly and laterally to the first injection site.  -Trapezius muscle injection was performed at 3 sites, bilaterally. The first injection site was in the upper trapezius muscle halfway between the inflection point of the neck, and the acromion. The  second injection site was one half way between the acromion and the first injection site. The third injection was done between the first injection site and the inflection point of the neck.   Will return for repeat injection in 3 months.   155 units of Botox was used, 45 Botox not injected was wasted. The patient tolerated the procedure well, there were no complications of the above procedure.

## 2022-06-18 ENCOUNTER — Other Ambulatory Visit (HOSPITAL_COMMUNITY): Payer: Self-pay

## 2022-06-19 ENCOUNTER — Other Ambulatory Visit: Payer: Self-pay

## 2022-06-19 MED ORDER — BETAMETHASONE DIPROPIONATE AUG 0.05 % EX OINT
TOPICAL_OINTMENT | CUTANEOUS | 0 refills | Status: DC
  Filled 2022-06-19: qty 50, 30d supply, fill #0
  Filled 2022-06-20: qty 50, 15d supply, fill #0

## 2022-06-20 ENCOUNTER — Other Ambulatory Visit: Payer: Self-pay

## 2022-06-20 ENCOUNTER — Other Ambulatory Visit (HOSPITAL_COMMUNITY): Payer: Self-pay

## 2022-06-21 ENCOUNTER — Other Ambulatory Visit: Payer: Self-pay

## 2022-06-25 ENCOUNTER — Ambulatory Visit (INDEPENDENT_AMBULATORY_CARE_PROVIDER_SITE_OTHER): Payer: No Typology Code available for payment source | Admitting: Neurology

## 2022-06-25 DIAGNOSIS — G43711 Chronic migraine without aura, intractable, with status migrainosus: Secondary | ICD-10-CM

## 2022-06-25 MED ORDER — ONABOTULINUMTOXINA 200 UNITS IJ SOLR
155.0000 [IU] | Freq: Once | INTRAMUSCULAR | Status: AC
  Administered 2022-06-25: 155 [IU] via INTRAMUSCULAR

## 2022-06-25 NOTE — Progress Notes (Signed)
Consent Form Botulism Toxin Injection For Chronic Migraine     06/25/2022: stable, Still doing great. >>70% improvement in migraine freq and severity. +a. 03/27/2022: stable, Still doing great. >>70% improvement in migraine freq and severity. +a. 12/26/2021: stable, doing great.  10/03/2021: stable, doing great.  07/04/2021: Still doing great. 60% improvement. Will see if insurance will approve Ajovy; Patient has copay card; she can have medication regardless of insurance approval or copay amount. She also had a reaction to Emgality and can't take aimovig, so I am sure a PA will be approved.  04/03/2021: stable doing fantastic 12/27/2020: stable doing great 09/27/2020; Hilarious lovely patient, still.EXCELLENT response. She [facility] to [facility] and actually told me about her, she says botox has changed her life. nurtec did not work, she likes tosymra and zembrace acutely but rarely needs it anymore. Baseline is daily headaches with 10 migraine days a month of which moderate or severe in intensity. Botox was great she had 0 migraines in 3 months. Watch out for "tommy lee brow" on right per patient. +a.  Reviewed orally with patient, additionally signature is on file: She lloved the Tosymra. Nurtec and ubrelvy eh. Imitrex injection ok. Awesome patient.   Botulism toxin has been approved by the Federal drug administration for treatment of chronic migraine. Botulism toxin does not cure chronic migraine and it may not be effective in some patients.  The administration of botulism toxin is accomplished by injecting a small amount of toxin into the muscles of the neck and head. Dosage must be titrated for each individual. Any benefits resulting from botulism toxin tend to wear off after 3 months with a repeat injection required if benefit is to be maintained. Injections are usually done every 3-4 months with maximum effect peak achieved by about 2 or 3 weeks. Botulism toxin is expensive and you should be sure  of what costs you will incur resulting from the injection.  The side effects of botulism toxin use for chronic migraine may include:   -Transient, and usually mild, facial weakness with facial injections  -Transient, and usually mild, head or neck weakness with head/neck injections  -Reduction or loss of forehead facial animation due to forehead muscle weakness  -Eyelid drooping  -Dry eye  -Pain at the site of injection or bruising at the site of injection  -Double vision  -Potential unknown long term risks  Contraindications: You should not have Botox if you are pregnant, nursing, allergic to albumin, have an infection, skin condition, or muscle weakness at the site of the injection, or have myasthenia gravis, Lambert-Eaton syndrome, or ALS.  It is also possible that as with any injection, there may be an allergic reaction or no effect from the medication. Reduced effectiveness after repeated injections is sometimes seen and rarely infection at the injection site may occur. All care will be taken to prevent these side effects. If therapy is given over a long time, atrophy and wasting in the muscle injected may occur. Occasionally the patient's become refractory to treatment because they develop antibodies to the toxin. In this event, therapy needs to be modified.  I have read the above information and consent to the administration of botulism toxin.    BOTOX PROCEDURE NOTE FOR MIGRAINE HEADACHE    Contraindications and precautions discussed with patient(above). Aseptic procedure was observed and patient tolerated procedure. Procedure performed by Dr. Georgia Dom  The condition has existed for more than 6 months, and pt does not have a diagnosis of ALS,  Myasthenia Gravis or Lambert-Eaton Syndrome.  Risks and benefits of injections discussed and pt agrees to proceed with the procedure.  Written consent obtained  These injections are medically necessary. Pt  receives good benefits from  these injections. These injections do not cause sedations or hallucinations which the oral therapies may cause.  Description of procedure:  The patient was placed in a sitting position. The standard protocol was used for Botox as follows, with 5 units of Botox injected at each site:   -Procerus muscle, midline injection  -Corrugator muscle, bilateral injection  -Frontalis muscle, bilateral injection, with 2 sites each side, medial injection was performed in the upper one third of the frontalis muscle, in the region vertical from the medial inferior edge of the superior orbital rim. The lateral injection was again in the upper one third of the forehead vertically above the lateral limbus of the cornea, 1.5 cm lateral to the medial injection site.  -Temporalis muscle injection, 4 sites, bilaterally. The first injection was 3 cm above the tragus of the ear, second injection site was 1.5 cm to 3 cm up from the first injection site in line with the tragus of the ear. The third injection site was 1.5-3 cm forward between the first 2 injection sites. The fourth injection site was 1.5 cm posterior to the second injection site.   -Occipitalis muscle injection, 3 sites, bilaterally. The first injection was done one half way between the occipital protuberance and the tip of the mastoid process behind the ear. The second injection site was done lateral and superior to the first, 1 fingerbreadth from the first injection. The third injection site was 1 fingerbreadth superiorly and medially from the first injection site.  -Cervical paraspinal muscle injection, 2 sites, bilateral knee first injection site was 1 cm from the midline of the cervical spine, 3 cm inferior to the lower border of the occipital protuberance. The second injection site was 1.5 cm superiorly and laterally to the first injection site.  -Trapezius muscle injection was performed at 3 sites, bilaterally. The first injection site was in the  upper trapezius muscle halfway between the inflection point of the neck, and the acromion. The second injection site was one half way between the acromion and the first injection site. The third injection was done between the first injection site and the inflection point of the neck.   Will return for repeat injection in 3 months.   155 units of Botox was used, 45 Botox not injected was wasted. The patient tolerated the procedure well, there were no complications of the above procedure.

## 2022-06-25 NOTE — Progress Notes (Signed)
Botox- 200 units x 1 vial Lot: [id number] Expiration: 11/2024 NDC: 9629-5284-13  Bacteriostatic 0.9% Sodium Chloride- 21m total Lot: [id number]: 04/07/2023 NDC: [address]-40 Dx: [id number]/B

## 2022-08-01 ENCOUNTER — Encounter: Payer: Self-pay | Admitting: Neurology

## 2022-08-15 DIAGNOSIS — F4321 Adjustment disorder with depressed mood: Secondary | ICD-10-CM | POA: Diagnosis not present

## 2022-08-29 DIAGNOSIS — F4321 Adjustment disorder with depressed mood: Secondary | ICD-10-CM | POA: Diagnosis not present

## 2022-09-05 ENCOUNTER — Telehealth: Payer: Self-pay | Admitting: Neurology

## 2022-09-05 NOTE — Telephone Encounter (Signed)
She had centivo, now we have no insurance on file for her and she has a botox appointment coming up.

## 2022-09-06 NOTE — Telephone Encounter (Signed)
Called pt. Asked if her insurance has changed pt said yes. Pt uploaded a new copy of her insurance card.

## 2022-09-06 NOTE — Telephone Encounter (Signed)
Chronic Migraine CPT 64615  Botox J0585 Units:200  G43.711 Chronic Migraine without aura, intractable, with status migrainous  Need PA w/ new [employer] plan in preparation for 09/17/22 appointment

## 2022-09-07 ENCOUNTER — Other Ambulatory Visit (HOSPITAL_COMMUNITY): Payer: Self-pay

## 2022-09-07 NOTE — Telephone Encounter (Signed)
BOTOX ONE-Benefit Verification [id number] Submitted!

## 2022-09-07 NOTE — Telephone Encounter (Signed)
Pharmacy Patient Advocate Encounter   Received notification from [employer] that prior authorization for Botox 200UNIT solution is required/requested.    PA submitted on 09/07/2022 to (ins) [employer] via CoverMyMeds Key [id number] Status is pending

## 2022-09-11 ENCOUNTER — Other Ambulatory Visit (HOSPITAL_COMMUNITY): Payer: Self-pay

## 2022-09-11 MED ORDER — ONABOTULINUMTOXINA 200 UNITS IJ SOLR
INTRAMUSCULAR | 3 refills | Status: DC
  Filled 2022-09-11: qty 1, 84d supply, fill #0
  Filled 2022-11-28: qty 1, 84d supply, fill #1
  Filled 2023-03-05: qty 1, 84d supply, fill #2
  Filled 2023-06-07: qty 1, 84d supply, fill #3

## 2022-09-11 NOTE — Telephone Encounter (Signed)
Rx sent to [facility]. 

## 2022-09-11 NOTE — Telephone Encounter (Signed)
Pt has appointment scheduled for 09/17/22, any updates on auth?

## 2022-09-11 NOTE — Addendum Note (Signed)
Addended by: Gildardo Griffes on: 09/11/2022 02:46 PM   Modules accepted: Orders

## 2022-09-11 NOTE — Telephone Encounter (Signed)
Pharmacy Patient Advocate Encounter  Prior Authorization for Botox 200UNIT solution has been approved.    PA# PA Case ID #: [id number] Effective dates: 09/11/2022 through 03/10/2023 Per test billing copay is $750.00 This can be filled with [facility]

## 2022-09-11 NOTE — Telephone Encounter (Signed)
Please send script to [facility], thank you.

## 2022-09-12 ENCOUNTER — Other Ambulatory Visit: Payer: Self-pay

## 2022-09-12 ENCOUNTER — Other Ambulatory Visit (HOSPITAL_COMMUNITY): Payer: Self-pay

## 2022-09-12 NOTE — Telephone Encounter (Signed)
[facility] has delivery scheduled for 09/13/22.

## 2022-09-14 DIAGNOSIS — F4321 Adjustment disorder with depressed mood: Secondary | ICD-10-CM | POA: Diagnosis not present

## 2022-09-17 ENCOUNTER — Ambulatory Visit: Payer: 59 | Admitting: Neurology

## 2022-09-17 DIAGNOSIS — G43711 Chronic migraine without aura, intractable, with status migrainosus: Secondary | ICD-10-CM

## 2022-09-17 MED ORDER — ONABOTULINUMTOXINA 200 UNITS IJ SOLR
155.0000 [IU] | Freq: Once | INTRAMUSCULAR | Status: AC
  Administered 2022-09-17: 155 [IU] via INTRAMUSCULAR

## 2022-09-17 MED ORDER — NURTEC 75 MG PO TBDP: 75.0000 mg | ORAL_TABLET | Freq: Every day | ORAL | 11 refills | Status: DC | PRN

## 2022-09-17 NOTE — Progress Notes (Signed)
Consent Form Botulism Toxin Injection For Chronic Migraine    09/17/2022: Doing great, stable. Only has 4 migraines a month now 06/25/2022: stable, Still doing great. >>70% improvement in migraine freq and severity. +a. 03/27/2022: stable, Still doing great. >>70% improvement in migraine freq and severity. +a. 12/26/2021: stable, doing great.  10/03/2021: stable, doing great.  07/04/2021: Still doing great. 60% improvement. Will see if insurance will approve Ajovy; Patient has copay card; she can have medication regardless of insurance approval or copay amount. She also had a reaction to Emgality and can't take aimovig, so I am sure a PA will be approved.  04/03/2021: stable doing fantastic 12/27/2020: stable doing great 09/27/2020; Hilarious lovely patient, still.EXCELLENT response. She has been to [facility] and actually told me about her, she says botox has changed her life. nurtec did not work, she likes tosymra and zembrace acutely but rarely needs it anymore. Baseline is daily headaches with 10 migraine days a month of which moderate or severe in intensity. Botox was great she had 0 migraines in 3 months. Watch out for "tommy lee brow" on right per patient. +a.  Reviewed orally with patient, additionally signature is on file: She lloved the Tosymra. Nurtec and ubrelvy eh. Imitrex injection ok. Awesome patient.   Botulism toxin has been approved by the Federal drug administration for treatment of chronic migraine. Botulism toxin does not cure chronic migraine and it may not be effective in some patients.  The administration of botulism toxin is accomplished by injecting a small amount of toxin into the muscles of the neck and head. Dosage must be titrated for each individual. Any benefits resulting from botulism toxin tend to wear off after 3 months with a repeat injection required if benefit is to be maintained. Injections are usually done every 3-4 months with maximum effect peak achieved by about 2  or 3 weeks. Botulism toxin is expensive and you should be sure of what costs you will incur resulting from the injection.  The side effects of botulism toxin use for chronic migraine may include:   -Transient, and usually mild, facial weakness with facial injections  -Transient, and usually mild, head or neck weakness with head/neck injections  -Reduction or loss of forehead facial animation due to forehead muscle weakness  -Eyelid drooping  -Dry eye  -Pain at the site of injection or bruising at the site of injection  -Double vision  -Potential unknown long term risks  Contraindications: You should not have Botox if you are pregnant, nursing, allergic to albumin, have an infection, skin condition, or muscle weakness at the site of the injection, or have myasthenia gravis, Lambert-Eaton syndrome, or ALS.  It is also possible that as with any injection, there may be an allergic reaction or no effect from the medication. Reduced effectiveness after repeated injections is sometimes seen and rarely infection at the injection site may occur. All care will be taken to prevent these side effects. If therapy is given over a long time, atrophy and wasting in the muscle injected may occur. Occasionally the patient's become refractory to treatment because they develop antibodies to the toxin. In this event, therapy needs to be modified.  I have read the above information and consent to the administration of botulism toxin.    BOTOX PROCEDURE NOTE FOR MIGRAINE HEADACHE    Contraindications and precautions discussed with patient(above). Aseptic procedure was observed and patient tolerated procedure. Procedure performed by Dr. Georgia Dom  The condition has existed for more than 6  months, and pt does not have a diagnosis of ALS, Myasthenia Gravis or Lambert-Eaton Syndrome.  Risks and benefits of injections discussed and pt agrees to proceed with the procedure.  Written consent obtained  These  injections are medically necessary. Pt  receives good benefits from these injections. These injections do not cause sedations or hallucinations which the oral therapies may cause.  Description of procedure:  The patient was placed in a sitting position. The standard protocol was used for Botox as follows, with 5 units of Botox injected at each site:   -Procerus muscle, midline injection  -Corrugator muscle, bilateral injection  -Frontalis muscle, bilateral injection, with 2 sites each side, medial injection was performed in the upper one third of the frontalis muscle, in the region vertical from the medial inferior edge of the superior orbital rim. The lateral injection was again in the upper one third of the forehead vertically above the lateral limbus of the cornea, 1.5 cm lateral to the medial injection site.  -Temporalis muscle injection, 4 sites, bilaterally. The first injection was 3 cm above the tragus of the ear, second injection site was 1.5 cm to 3 cm up from the first injection site in line with the tragus of the ear. The third injection site was 1.5-3 cm forward between the first 2 injection sites. The fourth injection site was 1.5 cm posterior to the second injection site.   -Occipitalis muscle injection, 3 sites, bilaterally. The first injection was done one half way between the occipital protuberance and the tip of the mastoid process behind the ear. The second injection site was done lateral and superior to the first, 1 fingerbreadth from the first injection. The third injection site was 1 fingerbreadth superiorly and medially from the first injection site.  -Cervical paraspinal muscle injection, 2 sites, bilateral knee first injection site was 1 cm from the midline of the cervical spine, 3 cm inferior to the lower border of the occipital protuberance. The second injection site was 1.5 cm superiorly and laterally to the first injection site.  -Trapezius muscle injection was  performed at 3 sites, bilaterally. The first injection site was in the upper trapezius muscle halfway between the inflection point of the neck, and the acromion. The second injection site was one half way between the acromion and the first injection site. The third injection was done between the first injection site and the inflection point of the neck.   Will return for repeat injection in 3 months.   155 units of Botox was used, 45 Botox not injected was wasted. The patient tolerated the procedure well, there were no complications of the above procedure.

## 2022-09-17 NOTE — Progress Notes (Signed)
Botox consent signed  Botox- 200 units x 1 vial Lot: [id number] Expiration: 01/2025 NDC: [id number]  Bacteriostatic 0.9% Sodium Chloride- 53m total Lot: [id number]: 11/25 NDC: [id number] Dx: [id number]/P

## 2022-09-24 DIAGNOSIS — F4321 Adjustment disorder with depressed mood: Secondary | ICD-10-CM | POA: Diagnosis not present

## 2022-09-25 DIAGNOSIS — D2271 Melanocytic nevi of right lower limb, including hip: Secondary | ICD-10-CM | POA: Diagnosis not present

## 2022-09-25 DIAGNOSIS — L700 Acne vulgaris: Secondary | ICD-10-CM | POA: Diagnosis not present

## 2022-09-25 DIAGNOSIS — D2261 Melanocytic nevi of right upper limb, including shoulder: Secondary | ICD-10-CM | POA: Diagnosis not present

## 2022-09-25 DIAGNOSIS — D2272 Melanocytic nevi of left lower limb, including hip: Secondary | ICD-10-CM | POA: Diagnosis not present

## 2022-09-25 DIAGNOSIS — L814 Other melanin hyperpigmentation: Secondary | ICD-10-CM | POA: Diagnosis not present

## 2022-09-25 DIAGNOSIS — D2262 Melanocytic nevi of left upper limb, including shoulder: Secondary | ICD-10-CM | POA: Diagnosis not present

## 2022-09-25 DIAGNOSIS — Z85820 Personal history of malignant melanoma of skin: Secondary | ICD-10-CM | POA: Diagnosis not present

## 2022-09-25 DIAGNOSIS — Z08 Encounter for follow-up examination after completed treatment for malignant neoplasm: Secondary | ICD-10-CM | POA: Diagnosis not present

## 2022-09-25 DIAGNOSIS — D225 Melanocytic nevi of trunk: Secondary | ICD-10-CM | POA: Diagnosis not present

## 2022-09-25 DIAGNOSIS — X32XXXA Exposure to sunlight, initial encounter: Secondary | ICD-10-CM | POA: Diagnosis not present

## 2022-10-02 ENCOUNTER — Telehealth: Payer: Self-pay | Admitting: Neurology

## 2022-10-02 NOTE — Telephone Encounter (Signed)
Regina French) following up on the PA for Rimegepant Sulfate (NURTEC) 75 MG TBDP.

## 2022-10-03 DIAGNOSIS — F4321 Adjustment disorder with depressed mood: Secondary | ICD-10-CM | POA: Diagnosis not present

## 2022-10-03 NOTE — Telephone Encounter (Signed)
Received PA to complete for Nurtec on [employer] website using key code [id number]. PA done. Awaiting determination from insurance.

## 2022-10-17 DIAGNOSIS — F4321 Adjustment disorder with depressed mood: Secondary | ICD-10-CM | POA: Diagnosis not present

## 2022-10-31 DIAGNOSIS — F4321 Adjustment disorder with depressed mood: Secondary | ICD-10-CM | POA: Diagnosis not present

## 2022-11-28 ENCOUNTER — Other Ambulatory Visit (HOSPITAL_COMMUNITY): Payer: Self-pay

## 2022-12-03 DIAGNOSIS — F4321 Adjustment disorder with depressed mood: Secondary | ICD-10-CM | POA: Diagnosis not present

## 2022-12-06 ENCOUNTER — Other Ambulatory Visit (HOSPITAL_COMMUNITY): Payer: Self-pay

## 2022-12-17 ENCOUNTER — Ambulatory Visit (INDEPENDENT_AMBULATORY_CARE_PROVIDER_SITE_OTHER): Payer: 59 | Admitting: Neurology

## 2022-12-17 ENCOUNTER — Encounter: Payer: Self-pay | Admitting: Neurology

## 2022-12-17 VITALS — BP 120/82 | HR 68 | Ht 64.0 in

## 2022-12-17 DIAGNOSIS — G43711 Chronic migraine without aura, intractable, with status migrainosus: Secondary | ICD-10-CM

## 2022-12-17 MED ORDER — ONABOTULINUMTOXINA 200 UNITS IJ SOLR
155.0000 [IU] | Freq: Once | INTRAMUSCULAR | Status: AC
  Administered 2022-12-17: 155 [IU] via INTRAMUSCULAR

## 2022-12-17 NOTE — Progress Notes (Signed)
Botox- 200 units x 1 vial Lot: [id number] Expiration: 01/2025 NDC: 4782-9562-13  Bacteriostatic 0.9% Sodium Chloride- 4 mL  Lot: [id number] Expiration: 11/05/2023 NDC: 4696-2952-84  Dx: X32.440 S/P  Witnessed by Delmer Islam

## 2022-12-17 NOTE — Progress Notes (Signed)
Consent Form Botulism Toxin Injection For Chronic Migraine  12/17/2022: stable  09/17/2022: Doing great, stable. Only has 4 migraines a month now 06/25/2022: stable, Still doing great. >>70% improvement in migraine freq and severity. +a. 03/27/2022: stable, Still doing great. >>70% improvement in migraine freq and severity. +a. 12/26/2021: stable, doing great.  10/03/2021: stable, doing great.  07/04/2021: Still doing great. 60% improvement. Will see if insurance will approve Ajovy; Patient has copay card; she can have medication regardless of insurance approval or copay amount. She also had a reaction to Emgality and can't take aimovig, so I am sure a PA will be approved.  04/03/2021: stable doing fantastic 12/27/2020: stable doing great 09/27/2020; Hilarious lovely patient, still.EXCELLENT response. She has [facility] to [facility] and actually told me about her, she says botox has changed her life. nurtec did not work, she likes tosymra and zembrace acutely but rarely needs it anymore. Baseline is daily headaches with 10 migraine days a month of which moderate or severe in intensity. Botox was great she had 0 migraines in 3 months. Watch out for "tommy lee brow" on right per patient. +a.  Reviewed orally with patient, additionally signature is on file: She lloved the Tosymra. Nurtec and ubrelvy eh. Imitrex injection ok. Awesome patient.   Botulism toxin has been approved by the Federal drug administration for treatment of chronic migraine. Botulism toxin does not cure chronic migraine and it may not be effective in some patients.  The administration of botulism toxin is accomplished by injecting a small amount of toxin into the muscles of the neck and head. Dosage must be titrated for each individual. Any benefits resulting from botulism toxin tend to wear off after 3 months with a repeat injection required if benefit is to be maintained. Injections are usually done every 3-4 months with maximum effect peak  achieved by about 2 or 3 weeks. Botulism toxin is expensive and you should be sure of what costs you will incur resulting from the injection.  The side effects of botulism toxin use for chronic migraine may include:   -Transient, and usually mild, facial weakness with facial injections  -Transient, and usually mild, head or neck weakness with head/neck injections  -Reduction or loss of forehead facial animation due to forehead muscle weakness  -Eyelid drooping  -Dry eye  -Pain at the site of injection or bruising at the site of injection  -Double vision  -Potential unknown long term risks  Contraindications: You should not have Botox if you are pregnant, nursing, allergic to albumin, have an infection, skin condition, or muscle weakness at the site of the injection, or have myasthenia gravis, Lambert-Eaton syndrome, or ALS.  It is also possible that as with any injection, there may be an allergic reaction or no effect from the medication. Reduced effectiveness after repeated injections is sometimes seen and rarely infection at the injection site may occur. All care will be taken to prevent these side effects. If therapy is given over a long time, atrophy and wasting in the muscle injected may occur. Occasionally the patient's become refractory to treatment because they develop antibodies to the toxin. In this event, therapy needs to be modified.  I have read the above information and consent to the administration of botulism toxin.    BOTOX PROCEDURE NOTE FOR MIGRAINE HEADACHE    Contraindications and precautions discussed with patient(above). Aseptic procedure was observed and patient tolerated procedure. Procedure performed by Dr. Artemio Aly  The condition has existed for more than  6 months, and pt does not have a diagnosis of ALS, Myasthenia Gravis or Lambert-Eaton Syndrome.  Risks and benefits of injections discussed and pt agrees to proceed with the procedure.  Written consent  obtained  These injections are medically necessary. Pt  receives good benefits from these injections. These injections do not cause sedations or hallucinations which the oral therapies may cause.  Description of procedure:  The patient was placed in a sitting position. The standard protocol was used for Botox as follows, with 5 units of Botox injected at each site:   -Procerus muscle, midline injection  -Corrugator muscle, bilateral injection  -Frontalis muscle, bilateral injection, with 2 sites each side, medial injection was performed in the upper one third of the frontalis muscle, in the region vertical from the medial inferior edge of the superior orbital rim. The lateral injection was again in the upper one third of the forehead vertically above the lateral limbus of the cornea, 1.5 cm lateral to the medial injection site.  -Temporalis muscle injection, 4 sites, bilaterally. The first injection was 3 cm above the tragus of the ear, second injection site was 1.5 cm to 3 cm up from the first injection site in line with the tragus of the ear. The third injection site was 1.5-3 cm forward between the first 2 injection sites. The fourth injection site was 1.5 cm posterior to the second injection site.   -Occipitalis muscle injection, 3 sites, bilaterally. The first injection was done one half way between the occipital protuberance and the tip of the mastoid process behind the ear. The second injection site was done lateral and superior to the first, 1 fingerbreadth from the first injection. The third injection site was 1 fingerbreadth superiorly and medially from the first injection site.  -Cervical paraspinal muscle injection, 2 sites, bilateral knee first injection site was 1 cm from the midline of the cervical spine, 3 cm inferior to the lower border of the occipital protuberance. The second injection site was 1.5 cm superiorly and laterally to the first injection site.  -Trapezius muscle  injection was performed at 3 sites, bilaterally. The first injection site was in the upper trapezius muscle halfway between the inflection point of the neck, and the acromion. The second injection site was one half way between the acromion and the first injection site. The third injection was done between the first injection site and the inflection point of the neck.   Will return for repeat injection in 3 months.   155 units of Botox was used, 45 Botox not injected was wasted. The patient tolerated the procedure well, there were no complications of the above procedure.

## 2022-12-25 DIAGNOSIS — Z85820 Personal history of malignant melanoma of skin: Secondary | ICD-10-CM | POA: Diagnosis not present

## 2022-12-25 DIAGNOSIS — D235 Other benign neoplasm of skin of trunk: Secondary | ICD-10-CM | POA: Diagnosis not present

## 2022-12-25 DIAGNOSIS — L814 Other melanin hyperpigmentation: Secondary | ICD-10-CM | POA: Diagnosis not present

## 2022-12-25 DIAGNOSIS — Z08 Encounter for follow-up examination after completed treatment for malignant neoplasm: Secondary | ICD-10-CM | POA: Diagnosis not present

## 2023-01-07 ENCOUNTER — Other Ambulatory Visit (HOSPITAL_COMMUNITY): Payer: Self-pay

## 2023-01-07 ENCOUNTER — Other Ambulatory Visit: Payer: Self-pay

## 2023-02-12 NOTE — Telephone Encounter (Signed)
Began auth renewal via CMM, status is pending.

## 2023-02-13 NOTE — Telephone Encounter (Signed)
Received fax of approval, Case ID #: [id number] (02/13/23-02/12/24).

## 2023-02-26 ENCOUNTER — Other Ambulatory Visit (HOSPITAL_COMMUNITY): Payer: Self-pay

## 2023-02-27 DIAGNOSIS — F411 Generalized anxiety disorder: Secondary | ICD-10-CM | POA: Diagnosis not present

## 2023-03-04 ENCOUNTER — Other Ambulatory Visit (HOSPITAL_COMMUNITY): Payer: Self-pay

## 2023-03-04 MED ORDER — FLUOXETINE HCL 20 MG PO CAPS
20.0000 mg | ORAL_CAPSULE | Freq: Every day | ORAL | 1 refills | Status: DC
  Filled 2023-03-04: qty 90, 90d supply, fill #0

## 2023-03-05 ENCOUNTER — Other Ambulatory Visit: Payer: Self-pay

## 2023-03-05 ENCOUNTER — Other Ambulatory Visit (HOSPITAL_COMMUNITY): Payer: Self-pay

## 2023-03-07 ENCOUNTER — Other Ambulatory Visit (HOSPITAL_COMMUNITY): Payer: Self-pay

## 2023-03-07 DIAGNOSIS — F324 Major depressive disorder, single episode, in partial remission: Secondary | ICD-10-CM | POA: Diagnosis not present

## 2023-03-07 DIAGNOSIS — Z6826 Body mass index (BMI) 26.0-26.9, adult: Secondary | ICD-10-CM | POA: Diagnosis not present

## 2023-03-07 DIAGNOSIS — F411 Generalized anxiety disorder: Secondary | ICD-10-CM | POA: Diagnosis not present

## 2023-03-07 DIAGNOSIS — Z0000 Encounter for general adult medical examination without abnormal findings: Secondary | ICD-10-CM | POA: Diagnosis not present

## 2023-03-07 DIAGNOSIS — Z13220 Encounter for screening for lipoid disorders: Secondary | ICD-10-CM | POA: Diagnosis not present

## 2023-03-07 DIAGNOSIS — G43009 Migraine without aura, not intractable, without status migrainosus: Secondary | ICD-10-CM | POA: Diagnosis not present

## 2023-03-07 MED ORDER — FLUOXETINE HCL 20 MG PO CAPS
20.0000 mg | ORAL_CAPSULE | Freq: Every day | ORAL | 3 refills | Status: DC
  Filled 2023-03-07: qty 90, 90d supply, fill #0

## 2023-03-14 ENCOUNTER — Other Ambulatory Visit (HOSPITAL_COMMUNITY): Payer: Self-pay

## 2023-03-14 ENCOUNTER — Ambulatory Visit: Payer: 59 | Admitting: Neurology

## 2023-03-15 ENCOUNTER — Encounter: Payer: Self-pay | Admitting: Neurology

## 2023-03-18 NOTE — Telephone Encounter (Signed)
When can she be r/s for botox?

## 2023-03-26 ENCOUNTER — Ambulatory Visit: Payer: 59 | Admitting: Neurology

## 2023-03-26 DIAGNOSIS — G43711 Chronic migraine without aura, intractable, with status migrainosus: Secondary | ICD-10-CM

## 2023-03-26 MED ORDER — ONABOTULINUMTOXINA 200 UNITS IJ SOLR
155.0000 [IU] | Freq: Once | INTRAMUSCULAR | Status: AC
  Administered 2023-03-26: 155 [IU] via INTRAMUSCULAR

## 2023-03-26 NOTE — Progress Notes (Signed)
Botox- 200 units x 1 vial Lot: [id number] Expiration: 06/2025 NDC: 7253-6644-03  Bacteriostatic 0.9% Sodium Chloride- 4 mL  Lot: [id number] Expiration: 11/05/2023 NDC: 5638-7564-33  Dx: I95.188 S/P  Witnessed by Elana Alm

## 2023-03-26 NOTE — Progress Notes (Signed)
Consent Form Botulism Toxin Injection For Chronic Migraine 03/26/2023: Doing great, stable. Still Only has 4 migraines a month now 12/17/2022: stable  09/17/2022: Doing great, stable. Only has 4 migraines a month now 06/25/2022: stable, Still doing great. >>70% improvement in migraine freq and severity. +a. 03/27/2022: stable, Still doing great. >>70% improvement in migraine freq and severity. +a. 12/26/2021: stable, doing great.  10/03/2021: stable, doing great.  07/04/2021: Still doing great. 60% improvement. Will see if insurance will approve Ajovy; Patient has copay card; she can have medication regardless of insurance approval or copay amount. She also had a reaction to Emgality and can't take aimovig, so I am sure a PA will be approved.  04/03/2021: stable doing fantastic 12/27/2020: stable doing great 09/27/2020; Hilarious lovely patient, still.EXCELLENT response. She has [facility] to [facility] and actually told me about her, she says botox has changed her life. nurtec did not work, she likes tosymra and zembrace acutely but rarely needs it anymore. Baseline is daily headaches with 10 migraine days a month of which moderate or severe in intensity. Botox was great she had 0 migraines in 3 months. Watch out for "tommy lee brow" on right per patient. +a.  Reviewed orally with patient, additionally signature is on file: She lloved the Tosymra. Nurtec and ubrelvy eh. Imitrex injection ok. Awesome patient.   Botulism toxin has been approved by the Federal drug administration for treatment of chronic migraine. Botulism toxin does not cure chronic migraine and it may not be effective in some patients.  The administration of botulism toxin is accomplished by injecting a small amount of toxin into the muscles of the neck and head. Dosage must be titrated for each individual. Any benefits resulting from botulism toxin tend to wear off after 3 months with a repeat injection required if benefit is to be maintained.  Injections are usually done every 3-4 months with maximum effect peak achieved by about 2 or 3 weeks. Botulism toxin is expensive and you should be sure of what costs you will incur resulting from the injection.  The side effects of botulism toxin use for chronic migraine may include:   -Transient, and usually mild, facial weakness with facial injections  -Transient, and usually mild, head or neck weakness with head/neck injections  -Reduction or loss of forehead facial animation due to forehead muscle weakness  -Eyelid drooping  -Dry eye  -Pain at the site of injection or bruising at the site of injection  -Double vision  -Potential unknown long term risks  Contraindications: You should not have Botox if you are pregnant, nursing, allergic to albumin, have an infection, skin condition, or muscle weakness at the site of the injection, or have myasthenia gravis, Lambert-Eaton syndrome, or ALS.  It is also possible that as with any injection, there may be an allergic reaction or no effect from the medication. Reduced effectiveness after repeated injections is sometimes seen and rarely infection at the injection site may occur. All care will be taken to prevent these side effects. If therapy is given over a long time, atrophy and wasting in the muscle injected may occur. Occasionally the patient's become refractory to treatment because they develop antibodies to the toxin. In this event, therapy needs to be modified.  I have read the above information and consent to the administration of botulism toxin.    BOTOX PROCEDURE NOTE FOR MIGRAINE HEADACHE    Contraindications and precautions discussed with patient(above). Aseptic procedure was observed and patient tolerated procedure. Procedure performed by  Dr. Artemio Aly  The condition has existed for more than 6 months, and pt does not have a diagnosis of ALS, Myasthenia Gravis or Lambert-Eaton Syndrome.  Risks and benefits of injections discussed  and pt agrees to proceed with the procedure.  Written consent obtained  These injections are medically necessary. Pt  receives good benefits from these injections. These injections do not cause sedations or hallucinations which the oral therapies may cause.  Description of procedure:  The patient was placed in a sitting position. The standard protocol was used for Botox as follows, with 5 units of Botox injected at each site:   -Procerus muscle, midline injection  -Corrugator muscle, bilateral injection  -Frontalis muscle, bilateral injection, with 2 sites each side, medial injection was performed in the upper one third of the frontalis muscle, in the region vertical from the medial inferior edge of the superior orbital rim. The lateral injection was again in the upper one third of the forehead vertically above the lateral limbus of the cornea, 1.5 cm lateral to the medial injection site.  -Temporalis muscle injection, 4 sites, bilaterally. The first injection was 3 cm above the tragus of the ear, second injection site was 1.5 cm to 3 cm up from the first injection site in line with the tragus of the ear. The third injection site was 1.5-3 cm forward between the first 2 injection sites. The fourth injection site was 1.5 cm posterior to the second injection site.   -Occipitalis muscle injection, 3 sites, bilaterally. The first injection was done one half way between the occipital protuberance and the tip of the mastoid process behind the ear. The second injection site was done lateral and superior to the first, 1 fingerbreadth from the first injection. The third injection site was 1 fingerbreadth superiorly and medially from the first injection site.  -Cervical paraspinal muscle injection, 2 sites, bilateral knee first injection site was 1 cm from the midline of the cervical spine, 3 cm inferior to the lower border of the occipital protuberance. The second injection site was 1.5 cm superiorly and  laterally to the first injection site.  -Trapezius muscle injection was performed at 3 sites, bilaterally. The first injection site was in the upper trapezius muscle halfway between the inflection point of the neck, and the acromion. The second injection site was one half way between the acromion and the first injection site. The third injection was done between the first injection site and the inflection point of the neck.   Will return for repeat injection in 3 months.   155 units of Botox was used, 45 Botox not injected was wasted. The patient tolerated the procedure well, there were no complications of the above procedure.

## 2023-03-27 DIAGNOSIS — F411 Generalized anxiety disorder: Secondary | ICD-10-CM | POA: Diagnosis not present

## 2023-04-17 ENCOUNTER — Other Ambulatory Visit (HOSPITAL_COMMUNITY): Payer: Self-pay

## 2023-04-17 ENCOUNTER — Telehealth: Payer: Self-pay

## 2023-04-17 NOTE — Telephone Encounter (Signed)
Pharmacy Patient Advocate Encounter  Received notification from [facility] that Prior Authorization for Nurtec 75MG  dispersible tablets has been APPROVED from 04/17/2023 to 04/16/2024.   PA #/Case ID/Reference #: PA Case ID #: [id number]

## 2023-04-17 NOTE — Telephone Encounter (Signed)
Pharmacy Patient Advocate Encounter   Received notification from Fax that prior authorization for Nurtec 75MG  dispersible tablets is required/requested.   Insurance verification completed.   The patient is insured through [facility] .   Per test claim: PA required; PA submitted to [facility] via CoverMyMeds Key/confirmation #/EOC [id number] Status is pending

## 2023-04-23 DIAGNOSIS — F411 Generalized anxiety disorder: Secondary | ICD-10-CM | POA: Diagnosis not present

## 2023-05-29 ENCOUNTER — Other Ambulatory Visit: Payer: Self-pay

## 2023-06-03 ENCOUNTER — Other Ambulatory Visit (HOSPITAL_COMMUNITY): Payer: Self-pay

## 2023-06-03 MED ORDER — FLUOXETINE HCL 40 MG PO CAPS
40.0000 mg | ORAL_CAPSULE | Freq: Every day | ORAL | 3 refills | Status: DC
  Filled 2023-06-03: qty 90, 90d supply, fill #0
  Filled 2023-09-09: qty 90, 90d supply, fill #1
  Filled 2023-12-03: qty 90, 90d supply, fill #2
  Filled 2024-03-02 – 2024-03-25 (×2): qty 90, 90d supply, fill #3

## 2023-06-05 ENCOUNTER — Other Ambulatory Visit (HOSPITAL_COMMUNITY): Payer: Self-pay

## 2023-06-07 ENCOUNTER — Encounter (HOSPITAL_COMMUNITY): Payer: Self-pay

## 2023-06-07 ENCOUNTER — Other Ambulatory Visit (HOSPITAL_COMMUNITY): Payer: Self-pay

## 2023-06-07 ENCOUNTER — Other Ambulatory Visit: Payer: Self-pay

## 2023-06-07 NOTE — Progress Notes (Signed)
Specialty Pharmacy Refill Coordination Note  Regina French is a 37 y.o. female contacted today regarding refills of specialty medication(s) Onabotulinumtoxina   Patient requested Delivery   Delivery date: 06/17/23   Verified address: [address], [place], [place] [address]   Medication will be filled on 06/14/23.

## 2023-06-18 ENCOUNTER — Ambulatory Visit: Payer: 59 | Admitting: Neurology

## 2023-06-18 DIAGNOSIS — G43711 Chronic migraine without aura, intractable, with status migrainosus: Secondary | ICD-10-CM

## 2023-06-18 MED ORDER — ONABOTULINUMTOXINA 200 UNITS IJ SOLR
155.0000 [IU] | Freq: Once | INTRAMUSCULAR | Status: AC
  Administered 2023-06-18: 155 [IU] via INTRAMUSCULAR

## 2023-06-18 NOTE — Progress Notes (Unsigned)
Botox- 200 units x 1 vial Lot: [id number]  Expiration: 10/2025 NDC: 4098-1191-47  Bacteriostatic 0.9% Sodium Chloride- 4 mL  Lot: [id number] Expiration: 11/05/2023 NDC: 1308-6578-46  Dx: N62.952 S/P  Witnessed by Delmer Islam

## 2023-06-20 NOTE — Progress Notes (Signed)
Consent Form Botulism Toxin Injection For Chronic Migraine  06/20/2023: doing fabtastic. She has jaw pain with her migraines, place 5U each masseters also +1 unit above right eye.  03/26/2023: Doing great, stable. Still Only has 4 migraines a month now 12/17/2022: stable  09/17/2022: Doing great, stable. Only has 4 migraines a month now 06/25/2022: stable, Still doing great. >>70% improvement in migraine freq and severity. +a. 03/27/2022: stable, Still doing great. >>70% improvement in migraine freq and severity. +a. 12/26/2021: stable, doing great.  10/03/2021: stable, doing great.  07/04/2021: Still doing great. 60% improvement. Will see if insurance will approve Ajovy; Patient has copay card; she can have medication regardless of insurance approval or copay amount. She also had a reaction to Emgality and can't take aimovig, so I am sure a PA will be approved.  04/03/2021: stable doing fantastic 12/27/2020: stable doing great 09/27/2020; Hilarious lovely patient, still.EXCELLENT response. She has been to [facility] and actually told me about her, she says botox has changed her life. nurtec did not work, she likes tosymra and zembrace acutely but rarely needs it anymore. Baseline is daily headaches with 10 migraine days a month of which moderate or severe in intensity. Botox was great she had 0 migraines in 3 months. Watch out for "tommy lee brow" on right per patient. +a.  Reviewed orally with patient, additionally signature is on file: She lloved the Tosymra. Nurtec and ubrelvy eh. Imitrex injection ok. Awesome patient.   Botulism toxin has been approved by the Federal drug administration for treatment of chronic migraine. Botulism toxin does not cure chronic migraine and it may not be effective in some patients.  The administration of botulism toxin is accomplished by injecting a small amount of toxin into the muscles of the neck and head. Dosage must be titrated for each individual. Any benefits  resulting from botulism toxin tend to wear off after 3 months with a repeat injection required if benefit is to be maintained. Injections are usually done every 3-4 months with maximum effect peak achieved by about 2 or 3 weeks. Botulism toxin is expensive and you should be sure of what costs you will incur resulting from the injection.  The side effects of botulism toxin use for chronic migraine may include:   -Transient, and usually mild, facial weakness with facial injections  -Transient, and usually mild, head or neck weakness with head/neck injections  -Reduction or loss of forehead facial animation due to forehead muscle weakness  -Eyelid drooping  -Dry eye  -Pain at the site of injection or bruising at the site of injection  -Double vision  -Potential unknown long term risks  Contraindications: You should not have Botox if you are pregnant, nursing, allergic to albumin, have an infection, skin condition, or muscle weakness at the site of the injection, or have myasthenia gravis, Lambert-Eaton syndrome, or ALS.  It is also possible that as with any injection, there may be an allergic reaction or no effect from the medication. Reduced effectiveness after repeated injections is sometimes seen and rarely infection at the injection site may occur. All care will be taken to prevent these side effects. If therapy is given over a long time, atrophy and wasting in the muscle injected may occur. Occasionally the patient's become refractory to treatment because they develop antibodies to the toxin. In this event, therapy needs to be modified.  I have read the above information and consent to the administration of botulism toxin.    BOTOX PROCEDURE NOTE FOR  MIGRAINE HEADACHE    Contraindications and precautions discussed with patient(above). Aseptic procedure was observed and patient tolerated procedure. Procedure performed by Dr. Artemio Aly  The condition has existed for more than 6 months,  and pt does not have a diagnosis of ALS, Myasthenia Gravis or Lambert-Eaton Syndrome.  Risks and benefits of injections discussed and pt agrees to proceed with the procedure.  Written consent obtained  These injections are medically necessary. Pt  receives good benefits from these injections. These injections do not cause sedations or hallucinations which the oral therapies may cause.  Description of procedure:  The patient was placed in a sitting position. The standard protocol was used for Botox as follows, with 5 units of Botox injected at each site:   -Procerus muscle, midline injection  -Corrugator muscle, bilateral injection  -Frontalis muscle, bilateral injection, with 2 sites each side, medial injection was performed in the upper one third of the frontalis muscle, in the region vertical from the medial inferior edge of the superior orbital rim. The lateral injection was again in the upper one third of the forehead vertically above the lateral limbus of the cornea, 1.5 cm lateral to the medial injection site.  -Temporalis muscle injection, 4 sites, bilaterally. The first injection was 3 cm above the tragus of the ear, second injection site was 1.5 cm to 3 cm up from the first injection site in line with the tragus of the ear. The third injection site was 1.5-3 cm forward between the first 2 injection sites. The fourth injection site was 1.5 cm posterior to the second injection site.   -Occipitalis muscle injection, 3 sites, bilaterally. The first injection was done one half way between the occipital protuberance and the tip of the mastoid process behind the ear. The second injection site was done lateral and superior to the first, 1 fingerbreadth from the first injection. The third injection site was 1 fingerbreadth superiorly and medially from the first injection site.  -Cervical paraspinal muscle injection, 2 sites, bilateral knee first injection site was 1 cm from the midline of the  cervical spine, 3 cm inferior to the lower border of the occipital protuberance. The second injection site was 1.5 cm superiorly and laterally to the first injection site.  -Trapezius muscle injection was performed at 3 sites, bilaterally. The first injection site was in the upper trapezius muscle halfway between the inflection point of the neck, and the acromion. The second injection site was one half way between the acromion and the first injection site. The third injection was done between the first injection site and the inflection point of the neck.   Will return for repeat injection in 3 months.   155 units of Botox was used, 45 Botox not injected was wasted. The patient tolerated the procedure well, there were no complications of the above procedure.

## 2023-08-20 ENCOUNTER — Other Ambulatory Visit (HOSPITAL_COMMUNITY): Payer: Self-pay

## 2023-08-27 DIAGNOSIS — F411 Generalized anxiety disorder: Secondary | ICD-10-CM | POA: Diagnosis not present

## 2023-09-04 DIAGNOSIS — H5213 Myopia, bilateral: Secondary | ICD-10-CM | POA: Diagnosis not present

## 2023-09-05 ENCOUNTER — Other Ambulatory Visit (HOSPITAL_COMMUNITY): Payer: Self-pay

## 2023-09-06 DIAGNOSIS — F411 Generalized anxiety disorder: Secondary | ICD-10-CM | POA: Diagnosis not present

## 2023-09-09 ENCOUNTER — Other Ambulatory Visit: Payer: Self-pay

## 2023-09-09 ENCOUNTER — Other Ambulatory Visit (HOSPITAL_COMMUNITY): Payer: Self-pay

## 2023-09-11 ENCOUNTER — Other Ambulatory Visit: Payer: Self-pay

## 2023-09-11 ENCOUNTER — Other Ambulatory Visit: Payer: Self-pay | Admitting: Neurology

## 2023-09-11 DIAGNOSIS — F411 Generalized anxiety disorder: Secondary | ICD-10-CM | POA: Diagnosis not present

## 2023-09-11 MED ORDER — BOTOX 200 UNITS IJ SOLR
INTRAMUSCULAR | 3 refills | Status: AC
  Filled 2023-09-11: qty 1, 84d supply, fill #0
  Filled 2023-12-16 (×2): qty 1, 84d supply, fill #1
  Filled 2024-03-27: qty 1, 84d supply, fill #2
  Filled 2024-06-29: qty 1, 84d supply, fill #3

## 2023-09-11 NOTE — Progress Notes (Signed)
 Specialty Pharmacy Refill Coordination Note  Regina French is a 38 y.o. female contacted today regarding refills of specialty medication(s) OnabotulinumtoxinA  (BOTOX )   Patient requested Courier to Provider Office   Delivery date: 09/17/23   Verified address: [facility] [address]   Medication will be filled on 09/16/23. Pending Refill Request. Appt scheduled 09/19/23.

## 2023-09-16 ENCOUNTER — Other Ambulatory Visit: Payer: Self-pay

## 2023-09-19 ENCOUNTER — Ambulatory Visit: Payer: Commercial Managed Care - PPO | Admitting: Neurology

## 2023-09-19 ENCOUNTER — Encounter: Payer: Self-pay | Admitting: Neurology

## 2023-09-19 DIAGNOSIS — G43711 Chronic migraine without aura, intractable, with status migrainosus: Secondary | ICD-10-CM

## 2023-09-19 MED ORDER — ONABOTULINUMTOXINA 200 UNITS IJ SOLR
155.0000 [IU] | Freq: Once | INTRAMUSCULAR | Status: AC
  Administered 2023-09-19: 155 [IU] via INTRAMUSCULAR

## 2023-09-19 NOTE — Progress Notes (Signed)
Botox- 200 units x 1 vial Lot: [id number] Expiration: 10/2025 NDC: 2130-8657-84  Bacteriostatic 0.9% Sodium Chloride- 4 mL  Lot: [id number] Expiration: 06/06/2024 NDC: 2841-3244-01  Dx: U27.253 S/P  Witnessed by Truitt Leep RN

## 2023-09-19 NOTE — Telephone Encounter (Signed)
Scanned in, previous [employer] auth carried over to new plan.

## 2023-09-19 NOTE — Progress Notes (Signed)
Consent Form Botulism Toxin Injection For Chronic Migraine  09/19/2023: Still doing great. >>70% improvement in migraine freq and severity. +a. 06/20/2023: doing fabtastic.  03/26/2023: Doing great, stable. Still Only has 4 migraines a month now 12/17/2022: stable  09/17/2022: Doing great, stable. Only has 4 migraines a month now 06/25/2022: stable, Still doing great. >>70% improvement in migraine freq and severity. +a. 03/27/2022: stable, Still doing great. >>70% improvement in migraine freq and severity. +a. 12/26/2021: stable, doing great.  10/03/2021: stable, doing great.  07/04/2021: Still doing great. 60% improvement. Will see if insurance will approve Ajovy; Patient has copay card; she can have medication regardless of insurance approval or copay amount. She also had a reaction to Emgality and can't take aimovig, so I am sure a PA will be approved.  04/03/2021: stable doing fantastic 12/27/2020: stable doing great 09/27/2020; Hilarious lovely patient, still.EXCELLENT response. She has [facility] and actually told me about her, she says botox has changed her life. nurtec did not work, she likes tosymra and zembrace acutely but rarely needs it anymore. Baseline is daily headaches with 10 migraine days a month of which moderate or severe in intensity. Botox was great she had 0 migraines in 3 months. Watch out for "tommy lee brow" on right per patient. +a.  Reviewed orally with patient, additionally signature is on file: She lloved the Tosymra. Nurtec and ubrelvy eh. Imitrex injection ok. Awesome patient.   Botulism toxin has been approved by the Federal drug administration for treatment of chronic migraine. Botulism toxin does not cure chronic migraine and it may not be effective in some patients.  The administration of botulism toxin is accomplished by injecting a small amount of toxin into the muscles of the neck and head. Dosage must be titrated for each individual. Any benefits resulting  from botulism toxin tend to wear off after 3 months with a repeat injection required if benefit is to be maintained. Injections are usually done every 3-4 months with maximum effect peak achieved by about 2 or 3 weeks. Botulism toxin is expensive and you should be sure of what costs you will incur resulting from the injection.  The side effects of botulism toxin use for chronic migraine may include:   -Transient, and usually mild, facial weakness with facial injections  -Transient, and usually mild, head or neck weakness with head/neck injections  -Reduction or loss of forehead facial animation due to forehead muscle weakness  -Eyelid drooping  -Dry eye  -Pain at the site of injection or bruising at the site of injection  -Double vision  -Potential unknown long term risks  Contraindications: You should not have Botox if you are pregnant, nursing, allergic to albumin, have an infection, skin condition, or muscle weakness at the site of the injection, or have myasthenia gravis, Lambert-Eaton syndrome, or ALS.  It is also possible that as with any injection, there may be an allergic reaction or no effect from the medication. Reduced effectiveness after repeated injections is sometimes seen and rarely infection at the injection site may occur. All care will be taken to prevent these side effects. If therapy is given over a long time, atrophy and wasting in the muscle injected may occur. Occasionally the patient's become refractory to treatment because they develop antibodies to the toxin. In this event, therapy needs to be modified.  I have read the above information and consent to the administration of botulism toxin.    BOTOX PROCEDURE NOTE FOR MIGRAINE HEADACHE  Contraindications and precautions discussed with patient(above). Aseptic procedure was observed and patient tolerated procedure. Procedure performed by Dr. Artemio Aly  The condition has existed for more than 6 months, and pt does  not have a diagnosis of ALS, Myasthenia Gravis or Lambert-Eaton Syndrome.  Risks and benefits of injections discussed and pt agrees to proceed with the procedure.  Written consent obtained  These injections are medically necessary. Pt  receives good benefits from these injections. These injections do not cause sedations or hallucinations which the oral therapies may cause.  Description of procedure:  The patient was placed in a sitting position. The standard protocol was used for Botox as follows, with 5 units of Botox injected at each site:   -Procerus muscle, midline injection  -Corrugator muscle, bilateral injection  -Frontalis muscle, bilateral injection, with 2 sites each side, medial injection was performed in the upper one third of the frontalis muscle, in the region vertical from the medial inferior edge of the superior orbital rim. The lateral injection was again in the upper one third of the forehead vertically above the lateral limbus of the cornea, 1.5 cm lateral to the medial injection site.  -Temporalis muscle injection, 4 sites, bilaterally. The first injection was 3 cm above the tragus of the ear, second injection site was 1.5 cm to 3 cm up from the first injection site in line with the tragus of the ear. The third injection site was 1.5-3 cm forward between the first 2 injection sites. The fourth injection site was 1.5 cm posterior to the second injection site.   -Occipitalis muscle injection, 3 sites, bilaterally. The first injection was done one half way between the occipital protuberance and the tip of the mastoid process behind the ear. The second injection site was done lateral and superior to the first, 1 fingerbreadth from the first injection. The third injection site was 1 fingerbreadth superiorly and medially from the first injection site.  -Cervical paraspinal muscle injection, 2 sites, bilateral knee first injection site was 1 cm from the midline of the cervical spine,  3 cm inferior to the lower border of the occipital protuberance. The second injection site was 1.5 cm superiorly and laterally to the first injection site.  -Trapezius muscle injection was performed at 3 sites, bilaterally. The first injection site was in the upper trapezius muscle halfway between the inflection point of the neck, and the acromion. The second injection site was one half way between the acromion and the first injection site. The third injection was done between the first injection site and the inflection point of the neck.   Will return for repeat injection in 3 months.   155 units of Botox was used, 45 Botox not injected was wasted. The patient tolerated the procedure well, there were no complications of the above procedure.

## 2023-10-03 ENCOUNTER — Telehealth: Payer: Self-pay | Admitting: Neurology

## 2023-10-03 NOTE — Telephone Encounter (Signed)
 Rescheduled 5/8 Botox appt to 5/13, MD changing schedule template.

## 2023-10-09 DIAGNOSIS — F411 Generalized anxiety disorder: Secondary | ICD-10-CM | POA: Diagnosis not present

## 2023-10-15 DIAGNOSIS — Z124 Encounter for screening for malignant neoplasm of cervix: Secondary | ICD-10-CM | POA: Diagnosis not present

## 2023-10-15 DIAGNOSIS — Z01411 Encounter for gynecological examination (general) (routine) with abnormal findings: Secondary | ICD-10-CM | POA: Diagnosis not present

## 2023-10-15 DIAGNOSIS — Z01419 Encounter for gynecological examination (general) (routine) without abnormal findings: Secondary | ICD-10-CM | POA: Diagnosis not present

## 2023-10-15 DIAGNOSIS — Z1331 Encounter for screening for depression: Secondary | ICD-10-CM | POA: Diagnosis not present

## 2023-10-15 DIAGNOSIS — N898 Other specified noninflammatory disorders of vagina: Secondary | ICD-10-CM | POA: Diagnosis not present

## 2023-10-15 DIAGNOSIS — Z113 Encounter for screening for infections with a predominantly sexual mode of transmission: Secondary | ICD-10-CM | POA: Diagnosis not present

## 2023-10-25 DIAGNOSIS — F411 Generalized anxiety disorder: Secondary | ICD-10-CM | POA: Diagnosis not present

## 2023-11-04 DIAGNOSIS — Z30430 Encounter for insertion of intrauterine contraceptive device: Secondary | ICD-10-CM | POA: Diagnosis not present

## 2023-11-04 DIAGNOSIS — N882 Stricture and stenosis of cervix uteri: Secondary | ICD-10-CM | POA: Diagnosis not present

## 2023-11-04 DIAGNOSIS — Z3202 Encounter for pregnancy test, result negative: Secondary | ICD-10-CM | POA: Diagnosis not present

## 2023-11-04 DIAGNOSIS — Z1231 Encounter for screening mammogram for malignant neoplasm of breast: Secondary | ICD-10-CM | POA: Diagnosis not present

## 2023-11-06 DIAGNOSIS — F411 Generalized anxiety disorder: Secondary | ICD-10-CM | POA: Diagnosis not present

## 2023-11-07 ENCOUNTER — Encounter: Payer: Self-pay | Admitting: Neurology

## 2023-11-07 MED ORDER — NURTEC 75 MG PO TBDP: 75.0000 mg | ORAL_TABLET | Freq: Every day | ORAL | 11 refills | Status: DC | PRN

## 2023-11-26 ENCOUNTER — Other Ambulatory Visit: Payer: Self-pay

## 2023-11-28 DIAGNOSIS — F411 Generalized anxiety disorder: Secondary | ICD-10-CM | POA: Diagnosis not present

## 2023-12-12 ENCOUNTER — Ambulatory Visit: Payer: Commercial Managed Care - PPO | Admitting: Neurology

## 2023-12-16 ENCOUNTER — Other Ambulatory Visit: Payer: Self-pay

## 2023-12-16 ENCOUNTER — Other Ambulatory Visit (HOSPITAL_COMMUNITY): Payer: Self-pay

## 2023-12-16 NOTE — Progress Notes (Signed)
 Specialty Pharmacy Refill Coordination Note  Regina French is a 38 y.o. female contacted today regarding refills of specialty medication(s) Botox .  Patient requested (Patient-Rptd) Delivery   Delivery date: (Patient-Rptd) 12/24/23   Verified address: (Patient-Rptd) Dr. Harding Li office [phone]   Medication will be filled on 12/24/23. New delivery date is 12/25/23. Patient has been notified.   [facility] [address] [place],  [place] [address]

## 2023-12-17 ENCOUNTER — Ambulatory Visit: Payer: Commercial Managed Care - PPO | Admitting: Neurology

## 2023-12-17 DIAGNOSIS — F411 Generalized anxiety disorder: Secondary | ICD-10-CM | POA: Diagnosis not present

## 2023-12-24 ENCOUNTER — Other Ambulatory Visit: Payer: Self-pay

## 2023-12-26 ENCOUNTER — Ambulatory Visit: Admitting: Neurology

## 2023-12-31 ENCOUNTER — Ambulatory Visit (INDEPENDENT_AMBULATORY_CARE_PROVIDER_SITE_OTHER): Admitting: Neurology

## 2023-12-31 VITALS — BP 107/58 | HR 67

## 2023-12-31 DIAGNOSIS — G43711 Chronic migraine without aura, intractable, with status migrainosus: Secondary | ICD-10-CM

## 2023-12-31 MED ORDER — ONABOTULINUMTOXINA 200 UNITS IJ SOLR
155.0000 [IU] | Freq: Once | INTRAMUSCULAR | Status: AC
  Administered 2023-12-31: 155 [IU] via INTRAMUSCULAR

## 2023-12-31 NOTE — Progress Notes (Unsigned)
 Botox - 200 units x 1 vial Lot: [id number]  Expiration: 05/2026 NDC: 5409-8119-14  Bacteriostatic 0.9% Sodium Chloride - 4 mL  Lot: [id number] Expiration: 01/2025 NDC:0409-1966-02 Dx G43.711 S/P  Witnessed by Sherrian Doheny

## 2023-12-31 NOTE — Progress Notes (Unsigned)
 Consent Form Botulism Toxin Injection For Chronic Migraine 12/31/2023: Still doing great. >>70% improvement in migraine freq and severity.  09/19/2023: Still doing great. >>70% improvement in migraine freq and severity.  06/20/2023: doing fabtastic.  03/26/2023: Doing great, stable. Still Only has 4 migraines a month now 12/17/2022: stable  09/17/2022: Doing great, stable. Only has 4 migraines a month now 06/25/2022: stable, Still doing great. >>70% improvement in migraine freq and severity. +a. 03/27/2022: stable, Still doing great. >>70% improvement in migraine freq and severity. +a. 12/26/2021: stable, doing great.  10/03/2021: stable, doing great.  07/04/2021: Still doing great. 60% improvement. Will see if insurance will approve Ajovy ; Patient has copay card; she can have medication regardless of insurance approval or copay amount. She also had a reaction to Emgality and can't take aimovig, so I am sure a PA will be approved.  04/03/2021: stable doing fantastic 12/27/2020: stable doing great 09/27/2020; Hilarious lovely patient, still.EXCELLENT response. She has [facility] and actually told me about her, she says botox  has changed her life. nurtec did not work, she likes tosymra  and zembrace acutely but rarely needs it anymore. Baseline is daily headaches with 10 migraine days a month of which moderate or severe in intensity. Botox  was great she had 0 migraines in 3 months. Watch out for "tommy lee brow" on right per patient. +a.  Reviewed orally with patient, additionally signature is on file: She lloved the Tosymra . Nurtec and ubrelvy  eh. Imitrex injection ok. Awesome patient.   Botulism toxin has been approved by the Federal drug administration for treatment of chronic migraine. Botulism toxin does not cure chronic migraine and it may not be effective in some patients.  The administration of botulism toxin is accomplished by injecting a small amount of toxin into the muscles of the neck and  head. Dosage must be titrated for each individual. Any benefits resulting from botulism toxin tend to wear off after 3 months with a repeat injection required if benefit is to be maintained. Injections are usually done every 3-4 months with maximum effect peak achieved by about 2 or 3 weeks. Botulism toxin is expensive and you should be sure of what costs you will incur resulting from the injection.  The side effects of botulism toxin use for chronic migraine may include:   -Transient, and usually mild, facial weakness with facial injections  -Transient, and usually mild, head or neck weakness with head/neck injections  -Reduction or loss of forehead facial animation due to forehead muscle weakness  -Eyelid drooping  -Dry eye  -Pain at the site of injection or bruising at the site of injection  -Double vision  -Potential unknown long term risks  Contraindications: You should not have Botox  if you are pregnant, nursing, allergic to albumin, have an infection, skin condition, or muscle weakness at the site of the injection, or have myasthenia gravis, Lambert-Eaton syndrome, or ALS.  It is also possible that as with any injection, there may be an allergic reaction or no effect from the medication. Reduced effectiveness after repeated injections is sometimes seen and rarely infection at the injection site may occur. All care will be taken to prevent these side effects. If therapy is given over a long time, atrophy and wasting in the muscle injected may occur. Occasionally the patient's become refractory to treatment because they develop antibodies to the toxin. In this event, therapy needs to be modified.  I have read the above information and consent to the administration of botulism toxin.  BOTOX  PROCEDURE NOTE FOR MIGRAINE HEADACHE    Contraindications and precautions discussed with patient(above). Aseptic procedure was observed and patient tolerated procedure. Procedure performed by Dr.  Criselda Dolly  The condition has existed for more than 6 months, and pt does not have a diagnosis of ALS, Myasthenia Gravis or Lambert-Eaton Syndrome.  Risks and benefits of injections discussed and pt agrees to proceed with the procedure.  Written consent obtained  These injections are medically necessary. Pt  receives good benefits from these injections. These injections do not cause sedations or hallucinations which the oral therapies may cause.  Description of procedure:  The patient was placed in a sitting position. The standard protocol was used for Botox  as follows, with 5 units of Botox  injected at each site:   -Procerus muscle, midline injection  -Corrugator muscle, bilateral injection  -Frontalis muscle, bilateral injection, with 2 sites each side, medial injection was performed in the upper one third of the frontalis muscle, in the region vertical from the medial inferior edge of the superior orbital rim. The lateral injection was again in the upper one third of the forehead vertically above the lateral limbus of the cornea, 1.5 cm lateral to the medial injection site.  -Temporalis muscle injection, 4 sites, bilaterally. The first injection was 3 cm above the tragus of the ear, second injection site was 1.5 cm to 3 cm up from the first injection site in line with the tragus of the ear. The third injection site was 1.5-3 cm forward between the first 2 injection sites. The fourth injection site was 1.5 cm posterior to the second injection site.   -Occipitalis muscle injection, 3 sites, bilaterally. The first injection was done one half way between the occipital protuberance and the tip of the mastoid process behind the ear. The second injection site was done lateral and superior to the first, 1 fingerbreadth from the first injection. The third injection site was 1 fingerbreadth superiorly and medially from the first injection site.  -Cervical paraspinal muscle injection, 2 sites, bilateral  knee first injection site was 1 cm from the midline of the cervical spine, 3 cm inferior to the lower border of the occipital protuberance. The second injection site was 1.5 cm superiorly and laterally to the first injection site.  -Trapezius muscle injection was performed at 3 sites, bilaterally. The first injection site was in the upper trapezius muscle halfway between the inflection point of the neck, and the acromion. The second injection site was one half way between the acromion and the first injection site. The third injection was done between the first injection site and the inflection point of the neck.   Will return for repeat injection in 3 months.   155 units of Botox  was used, 45 Botox  not injected was wasted. The patient tolerated the procedure well, there were no complications of the above procedure.

## 2024-01-07 DIAGNOSIS — F411 Generalized anxiety disorder: Secondary | ICD-10-CM | POA: Diagnosis not present

## 2024-02-05 DIAGNOSIS — F411 Generalized anxiety disorder: Secondary | ICD-10-CM | POA: Diagnosis not present

## 2024-02-19 DIAGNOSIS — L814 Other melanin hyperpigmentation: Secondary | ICD-10-CM | POA: Diagnosis not present

## 2024-02-19 DIAGNOSIS — D2272 Melanocytic nevi of left lower limb, including hip: Secondary | ICD-10-CM | POA: Diagnosis not present

## 2024-02-19 DIAGNOSIS — Z08 Encounter for follow-up examination after completed treatment for malignant neoplasm: Secondary | ICD-10-CM | POA: Diagnosis not present

## 2024-02-19 DIAGNOSIS — D235 Other benign neoplasm of skin of trunk: Secondary | ICD-10-CM | POA: Diagnosis not present

## 2024-02-19 DIAGNOSIS — D2262 Melanocytic nevi of left upper limb, including shoulder: Secondary | ICD-10-CM | POA: Diagnosis not present

## 2024-02-19 DIAGNOSIS — Z85820 Personal history of malignant melanoma of skin: Secondary | ICD-10-CM | POA: Diagnosis not present

## 2024-02-19 DIAGNOSIS — D225 Melanocytic nevi of trunk: Secondary | ICD-10-CM | POA: Diagnosis not present

## 2024-02-19 DIAGNOSIS — D2372 Other benign neoplasm of skin of left lower limb, including hip: Secondary | ICD-10-CM | POA: Diagnosis not present

## 2024-02-19 DIAGNOSIS — D2261 Melanocytic nevi of right upper limb, including shoulder: Secondary | ICD-10-CM | POA: Diagnosis not present

## 2024-02-19 DIAGNOSIS — D485 Neoplasm of uncertain behavior of skin: Secondary | ICD-10-CM | POA: Diagnosis not present

## 2024-02-19 DIAGNOSIS — D2271 Melanocytic nevi of right lower limb, including hip: Secondary | ICD-10-CM | POA: Diagnosis not present

## 2024-02-27 DIAGNOSIS — F411 Generalized anxiety disorder: Secondary | ICD-10-CM | POA: Diagnosis not present

## 2024-03-03 ENCOUNTER — Other Ambulatory Visit: Payer: Self-pay

## 2024-03-06 ENCOUNTER — Other Ambulatory Visit: Payer: Self-pay

## 2024-03-10 NOTE — Telephone Encounter (Signed)
 Submitted auth renewal request via CMM, status is pending. Key: [id number]

## 2024-03-23 NOTE — Telephone Encounter (Signed)
 Received approval, pt will continue to fill through [facility].  Auth#: [id number] (03/12/24-03/11/25)

## 2024-03-27 ENCOUNTER — Other Ambulatory Visit: Payer: Self-pay | Admitting: Pharmacy Technician

## 2024-03-27 ENCOUNTER — Other Ambulatory Visit: Payer: Self-pay

## 2024-03-27 NOTE — Progress Notes (Signed)
 Specialty Pharmacy Refill Coordination Note  Regina French is a 38 y.o. female contacted today regarding refills of specialty medication(s) OnabotulinumtoxinA  (Botox )   Patient requested Courier to Provider Office   Delivery date: 03/30/24   Verified address: [facility] [address]   Medication will be filled on 03/27/24.  Injection Appointment: 04/08/2024

## 2024-04-08 ENCOUNTER — Other Ambulatory Visit (HOSPITAL_COMMUNITY): Payer: Self-pay

## 2024-04-08 ENCOUNTER — Encounter: Payer: Self-pay | Admitting: Neurology

## 2024-04-08 ENCOUNTER — Ambulatory Visit (INDEPENDENT_AMBULATORY_CARE_PROVIDER_SITE_OTHER): Admitting: Neurology

## 2024-04-08 ENCOUNTER — Telehealth: Payer: Self-pay

## 2024-04-08 VITALS — BP 109/72 | HR 61

## 2024-04-08 DIAGNOSIS — G43711 Chronic migraine without aura, intractable, with status migrainosus: Secondary | ICD-10-CM | POA: Diagnosis not present

## 2024-04-08 DIAGNOSIS — G43009 Migraine without aura, not intractable, without status migrainosus: Secondary | ICD-10-CM | POA: Insufficient documentation

## 2024-04-08 MED ORDER — ONABOTULINUMTOXINA 200 UNITS IJ SOLR
155.0000 [IU] | Freq: Once | INTRAMUSCULAR | Status: AC
  Administered 2024-04-08: 155 [IU] via INTRAMUSCULAR

## 2024-04-08 MED ORDER — NURTEC 75 MG PO TBDP: 75.0000 mg | ORAL_TABLET | Freq: Every day | ORAL | 11 refills | Status: DC | PRN

## 2024-04-08 NOTE — Progress Notes (Signed)
 Consent Form Botulism Toxin Injection For Chronic Migraine  04/08/2024: Nurtec works well. table, Still doing great. >>70% improvement in migraine freq and severity. +a . 4 migriane days a month, < 8 total headache days a month failed imitrex, maxalt  12/31/2023: Still doing great. >>70% improvement in migraine freq and severity.  09/19/2023: Still doing great. >>70% improvement in migraine freq and severity.  06/20/2023: doing fabtastic.  03/26/2023: Doing great, stable. Still Only has 4 migraines a month now 12/17/2022: stable  09/17/2022: Doing great, stable. Only has 4 migraines a month now 06/25/2022: stable, Still doing great. >>70% improvement in migraine freq and severity. +a. 03/27/2022: stable, Still doing great. >>70% improvement in migraine freq and severity. +a. 12/26/2021: stable, doing great.  10/03/2021: stable, doing great.  07/04/2021: Still doing great. 60% improvement. Will see if insurance will approve Ajovy ; Patient has copay card; she can have medication regardless of insurance approval or copay amount. She also had a reaction to Emgality and can't take aimovig, so I am sure a PA will be approved.  04/03/2021: stable doing fantastic 12/27/2020: stable doing great 09/27/2020; Hilarious lovely patient, still.EXCELLENT response. She has been to [facility] and actually told me about her, she says botox  has changed her life. nurtec did not work, she likes tosymra  and zembrace acutely but rarely needs it anymore. Baseline is daily headaches with 10 migraine days a month of which moderate or severe in intensity. Botox  was great she had 0 migraines in 3 months. Watch out for tommy lee brow on right per patient. +a.  Reviewed orally with patient, additionally signature is on file: She lloved the Tosymra . Nurtec and ubrelvy  eh. Imitrex injection ok. Awesome patient.   Botulism toxin has been approved by the Federal drug administration for treatment of chronic migraine. Botulism toxin does not  cure chronic migraine and it may not be effective in some patients.  The administration of botulism toxin is accomplished by injecting a small amount of toxin into the muscles of the neck and head. Dosage must be titrated for each individual. Any benefits resulting from botulism toxin tend to wear off after 3 months with a repeat injection required if benefit is to be maintained. Injections are usually done every 3-4 months with maximum effect peak achieved by about 2 or 3 weeks. Botulism toxin is expensive and you should be sure of what costs you will incur resulting from the injection.  The side effects of botulism toxin use for chronic migraine may include:   -Transient, and usually mild, facial weakness with facial injections  -Transient, and usually mild, head or neck weakness with head/neck injections  -Reduction or loss of forehead facial animation due to forehead muscle weakness  -Eyelid drooping  -Dry eye  -Pain at the site of injection or bruising at the site of injection  -Double vision  -Potential unknown long term risks  Contraindications: You should not have Botox  if you are pregnant, nursing, allergic to albumin, have an infection, skin condition, or muscle weakness at the site of the injection, or have myasthenia gravis, Lambert-Eaton syndrome, or ALS.  It is also possible that as with any injection, there may be an allergic reaction or no effect from the medication. Reduced effectiveness after repeated injections is sometimes seen and rarely infection at the injection site may occur. All care will be taken to prevent these side effects. If therapy is given over a long time, atrophy and wasting in the muscle injected may occur. Occasionally the patient's become  refractory to treatment because they develop antibodies to the toxin. In this event, therapy needs to be modified.  I have read the above information and consent to the administration of botulism toxin.    BOTOX   PROCEDURE NOTE FOR MIGRAINE HEADACHE    Contraindications and precautions discussed with patient(above). Aseptic procedure was observed and patient tolerated procedure. Procedure performed by Dr. Andree Epp  The condition has existed for more than 6 months, and pt does not have a diagnosis of ALS, Myasthenia Gravis or Lambert-Eaton Syndrome.  Risks and benefits of injections discussed and pt agrees to proceed with the procedure.  Written consent obtained  These injections are medically necessary. Pt  receives good benefits from these injections. These injections do not cause sedations or hallucinations which the oral therapies may cause.  Description of procedure:  The patient was placed in a sitting position. The standard protocol was used for Botox  as follows, with 5 units of Botox  injected at each site:   -Procerus muscle, midline injection  -Corrugator muscle, bilateral injection  -Frontalis muscle, bilateral injection, with 2 sites each side, medial injection was performed in the upper one third of the frontalis muscle, in the region vertical from the medial inferior edge of the superior orbital rim. The lateral injection was again in the upper one third of the forehead vertically above the lateral limbus of the cornea, 1.5 cm lateral to the medial injection site.  -Temporalis muscle injection, 4 sites, bilaterally. The first injection was 3 cm above the tragus of the ear, second injection site was 1.5 cm to 3 cm up from the first injection site in line with the tragus of the ear. The third injection site was 1.5-3 cm forward between the first 2 injection sites. The fourth injection site was 1.5 cm posterior to the second injection site.   -Occipitalis muscle injection, 3 sites, bilaterally. The first injection was done one half way between the occipital protuberance and the tip of the mastoid process behind the ear. The second injection site was done lateral and superior to the first, 1  fingerbreadth from the first injection. The third injection site was 1 fingerbreadth superiorly and medially from the first injection site.  -Cervical paraspinal muscle injection, 2 sites, bilateral knee first injection site was 1 cm from the midline of the cervical spine, 3 cm inferior to the lower border of the occipital protuberance. The second injection site was 1.5 cm superiorly and laterally to the first injection site.  -Trapezius muscle injection was performed at 3 sites, bilaterally. The first injection site was in the upper trapezius muscle halfway between the inflection point of the neck, and the acromion. The second injection site was one half way between the acromion and the first injection site. The third injection was done between the first injection site and the inflection point of the neck.   Will return for repeat injection in 3 months.   155 units of Botox  was used, 45 Botox  not injected was wasted. The patient tolerated the procedure well, there were no complications of the above procedure.

## 2024-04-08 NOTE — Telephone Encounter (Signed)
 Pharmacy Patient Advocate Encounter   Received notification from Fax that prior authorization for Nurtec 75mg  Tablet is required/requested.   Insurance verification completed.   The patient is insured through [facility] .   Per test claim: PA required; PA submitted to above mentioned insurance via Latent Key/confirmation #/EOC [id number] Status is pending

## 2024-04-08 NOTE — Progress Notes (Signed)
 Botox - 200 units x 1 vial Lot: [id number] Expiration: 06/2026 NDC: 9976-6078-97   Bacteriostatic 0.9% Sodium Chloride - 4 mL  Lot: [id number] Expiration: 10/31//2026 WIR:9590-8033-97 Dx G43.711 S/P  Witnessed by Heather MOTE RN

## 2024-04-21 ENCOUNTER — Other Ambulatory Visit (HOSPITAL_COMMUNITY): Payer: Self-pay

## 2024-04-21 NOTE — Telephone Encounter (Signed)
 Pharmacy Patient Advocate Encounter  Received notification from [employer] that Prior Authorization for Nurtec has been APPROVED from 04/16/24 to 04/15/25. Ran test claim, Copay is $0. This test claim was processed through [facility]- copay amounts may vary at other pharmacies due to pharmacy/plan contracts, or as the patient moves through the different stages of their insurance plan.   PA #/Case ID/Reference #: [id number]

## 2024-04-29 ENCOUNTER — Other Ambulatory Visit (HOSPITAL_COMMUNITY): Payer: Self-pay

## 2024-04-29 DIAGNOSIS — F411 Generalized anxiety disorder: Secondary | ICD-10-CM | POA: Diagnosis not present

## 2024-04-29 DIAGNOSIS — Z6826 Body mass index (BMI) 26.0-26.9, adult: Secondary | ICD-10-CM | POA: Diagnosis not present

## 2024-04-29 DIAGNOSIS — G43009 Migraine without aura, not intractable, without status migrainosus: Secondary | ICD-10-CM | POA: Diagnosis not present

## 2024-04-29 DIAGNOSIS — F324 Major depressive disorder, single episode, in partial remission: Secondary | ICD-10-CM | POA: Diagnosis not present

## 2024-04-29 DIAGNOSIS — Z0000 Encounter for general adult medical examination without abnormal findings: Secondary | ICD-10-CM | POA: Diagnosis not present

## 2024-04-29 MED ORDER — FLUOXETINE HCL 40 MG PO CAPS
40.0000 mg | ORAL_CAPSULE | Freq: Every day | ORAL | 1 refills | Status: AC
  Filled 2024-06-26: qty 90, 90d supply, fill #0

## 2024-05-18 NOTE — Telephone Encounter (Signed)
 Agree with who you were recommending for patient.

## 2024-05-18 NOTE — Telephone Encounter (Signed)
 I called pt to r/s her 12/2 Botox  to another provider. She is requesting a call back from [employer] because she knows and is comfortable with her and would like her opinion on who she should go with.

## 2024-05-19 NOTE — Telephone Encounter (Signed)
 Called and spoke with patient and she was agreeable to reschedule with an NP for her Botox  injections. I scheduled her with Harlene for 07/07/24 at 7:45am

## 2024-06-08 DIAGNOSIS — M542 Cervicalgia: Secondary | ICD-10-CM | POA: Diagnosis not present

## 2024-06-08 DIAGNOSIS — M26629 Arthralgia of temporomandibular joint, unspecified side: Secondary | ICD-10-CM | POA: Diagnosis not present

## 2024-06-11 ENCOUNTER — Other Ambulatory Visit: Payer: Self-pay

## 2024-06-13 ENCOUNTER — Ambulatory Visit: Payer: Self-pay

## 2024-06-26 ENCOUNTER — Other Ambulatory Visit: Payer: Self-pay

## 2024-06-29 ENCOUNTER — Other Ambulatory Visit: Payer: Self-pay

## 2024-06-29 ENCOUNTER — Other Ambulatory Visit (HOSPITAL_COMMUNITY): Payer: Self-pay

## 2024-06-29 NOTE — Progress Notes (Signed)
 Specialty Pharmacy Refill Coordination Note  Clear Bag Patient  Regina French is a 38 y.o. female contacted today regarding refills of specialty medication(s) OnabotulinumtoxinA  (Botox )  Doses on hand: 0   Injection appointment: 07/07/24  Patient requested: Courier to Provider Office   Delivery date: 06/30/24   Verified address: [facility] [address] [place] [address]  Medication will be filled on 06/29/24 .

## 2024-07-06 NOTE — Progress Notes (Unsigned)
 Update 07/07/2024 JM: Patient returns for repeat Botox .  Prior injection with Dr. Ines 04/08/2024.         Consent Form Botulism Toxin Injection For Chronic Migraine    Reviewed orally with patient, additionally signature is on file:  Botulism toxin has been approved by the Federal drug administration for treatment of chronic migraine. Botulism toxin does not cure chronic migraine and it may not be effective in some patients.  The administration of botulism toxin is accomplished by injecting a small amount of toxin into the muscles of the neck and head. Dosage must be titrated for each individual. Any benefits resulting from botulism toxin tend to wear off after 3 months with a repeat injection required if benefit is to be maintained. Injections are usually done every 3-4 months with maximum effect peak achieved by about 2 or 3 weeks. Botulism toxin is expensive and you should be sure of what costs you will incur resulting from the injection.  The side effects of botulism toxin use for chronic migraine may include:   -Transient, and usually mild, facial weakness with facial injections  -Transient, and usually mild, head or neck weakness with head/neck injections  -Reduction or loss of forehead facial animation due to forehead muscle weakness  -Eyelid drooping  -Dry eye  -Pain at the site of injection or bruising at the site of injection  -Double vision  -Potential unknown long term risks   Contraindications: You should not have Botox  if you are pregnant, nursing, allergic to albumin, have an infection, skin condition, or muscle weakness at the site of the injection, or have myasthenia gravis, Lambert-Eaton syndrome, or ALS.  It is also possible that as with any injection, there may be an allergic reaction or no effect from the medication. Reduced effectiveness after repeated injections is sometimes seen and rarely infection at the injection site may occur. All care will be  taken to prevent these side effects. If therapy is given over a long time, atrophy and wasting in the muscle injected may occur. Occasionally the patient's become refractory to treatment because they develop antibodies to the toxin. In this event, therapy needs to be modified.  I have read the above information and consent to the administration of botulism toxin.    BOTOX  PROCEDURE NOTE FOR MIGRAINE HEADACHE  Contraindications and precautions discussed with patient(above). Aseptic procedure was observed and patient tolerated procedure. Procedure performed by Harlene Bogaert, AGNP-BC.   The condition has existed for more than 6 months, and pt does not have a diagnosis of ALS, Myasthenia Gravis or Lambert-Eaton Syndrome.  Risks and benefits of injections discussed and pt agrees to proceed with the procedure.  Written consent obtained  These injections are medically necessary. Pt  receives good benefits from these injections. These injections do not cause sedations or hallucinations which the oral therapies may cause.   Description of procedure:  The patient was placed in a sitting position. The standard protocol was used for Botox  as follows, with 5 units of Botox  injected at each site:  -Procerus muscle, midline injection  -Corrugator muscle, bilateral injection  -Frontalis muscle, bilateral injection, with 2 sites each side, medial injection was performed in the upper one third of the frontalis muscle, in the region vertical from the medial inferior edge of the superior orbital rim. The lateral injection was again in the upper one third of the forehead vertically above the lateral limbus of the cornea, 1.5 cm lateral to the medial injection site.  -  Temporalis muscle injection, 4 sites, bilaterally. The first injection was 3 cm above the tragus of the ear, second injection site was 1.5 cm to 3 cm up from the first injection site in line with the tragus of the ear. The third injection site was  1.5-3 cm forward between the first 2 injection sites. The fourth injection site was 1.5 cm posterior to the second injection site. 5th site laterally in the temporalis  muscleat the level of the outer canthus.  -Occipitalis muscle injection, 3 sites, bilaterally. The first injection was done one half way between the occipital protuberance and the tip of the mastoid process behind the ear. The second injection site was done lateral and superior to the first, 1 fingerbreadth from the first injection. The third injection site was 1 fingerbreadth superiorly and medially from the first injection site.  -Cervical paraspinal muscle injection, 2 sites, bilaterally. The first injection site was 1 cm from the midline of the cervical spine, 3 cm inferior to the lower border of the occipital protuberance. The second injection site was 1.5 cm superiorly and laterally to the first injection site.  -Trapezius muscle injection was performed at 3 sites, bilaterally. The first injection site was in the upper trapezius muscle halfway between the inflection point of the neck, and the acromion. The second injection site was one half way between the acromion and the first injection site. The third injection was done between the first injection site and the inflection point of the neck.    A total of 200 units of Botox  was prepared, 155 units of Botox  was injected as documented above, any Botox  not injected was wasted. The patient tolerated the procedure well, there were no complications of the above procedure.   Harlene Bogaert, AGNP-BC  [facility] [address] [place], [place] [address]  Phone [phone] Fax [phone] Note: This document was prepared with digital dictation and possible smart phrase technology. Any transcriptional errors that result from this process are unintentional.

## 2024-07-07 ENCOUNTER — Ambulatory Visit: Admitting: Neurology

## 2024-07-07 ENCOUNTER — Other Ambulatory Visit (HOSPITAL_COMMUNITY): Payer: Self-pay

## 2024-07-07 ENCOUNTER — Encounter (HOSPITAL_COMMUNITY): Payer: Self-pay

## 2024-07-07 ENCOUNTER — Ambulatory Visit: Admitting: Adult Health

## 2024-07-07 ENCOUNTER — Other Ambulatory Visit: Payer: Self-pay

## 2024-07-07 VITALS — BP 113/74 | HR 71

## 2024-07-07 DIAGNOSIS — G43009 Migraine without aura, not intractable, without status migrainosus: Secondary | ICD-10-CM

## 2024-07-07 DIAGNOSIS — G43711 Chronic migraine without aura, intractable, with status migrainosus: Secondary | ICD-10-CM

## 2024-07-07 MED ORDER — NURTEC 75 MG PO TBDP
75.0000 mg | ORAL_TABLET | Freq: Every day | ORAL | 11 refills | Status: AC | PRN
  Filled 2024-07-07: qty 16, 30d supply, fill #0
  Filled 2024-07-07: qty 16, 32d supply, fill #0

## 2024-07-07 MED ORDER — ONABOTULINUMTOXINA 200 UNITS IJ SOLR
155.0000 [IU] | Freq: Once | INTRAMUSCULAR | Status: AC
  Administered 2024-07-07: 155 [IU] via INTRAMUSCULAR

## 2024-07-07 NOTE — Progress Notes (Signed)
 Botox - 200 units x 1 vial Lot: [id number] Expiration: 10/2026 NDC: 9976-6078-97   Bacteriostatic 0.9% Sodium Chloride - 4 mL  Lot: [id number] Expiration: 10/31//2026 NDC:0409-1966-02  Dx G43.711 S/P  Witnessed by Rojean DEL

## 2024-09-30 ENCOUNTER — Ambulatory Visit: Admitting: Adult Health
